# Patient Record
Sex: Female | Born: 1990 | Race: White | Hispanic: No | Marital: Single | Smoking: Never smoker
Health system: Southern US, Community
[De-identification: ages and names within clinical notes are randomized; demographics above are authoritative.]

## PROBLEM LIST (undated history)

## (undated) ENCOUNTER — Inpatient Hospital Stay (HOSPITAL_COMMUNITY): Payer: Self-pay

## (undated) DIAGNOSIS — G43909 Migraine, unspecified, not intractable, without status migrainosus: Secondary | ICD-10-CM

## (undated) DIAGNOSIS — F419 Anxiety disorder, unspecified: Secondary | ICD-10-CM

## (undated) HISTORY — PX: TONSILLECTOMY: SUR1361

## (undated) HISTORY — DX: Migraine, unspecified, not intractable, without status migrainosus: G43.909

---
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
## 2012-07-01 ENCOUNTER — Other Ambulatory Visit: Payer: Self-pay | Admitting: Diagnostic Neuroimaging

## 2012-07-01 DIAGNOSIS — H4710 Unspecified papilledema: Secondary | ICD-10-CM

## 2012-07-15 ENCOUNTER — Other Ambulatory Visit: Payer: Self-pay

## 2012-07-16 ENCOUNTER — Ambulatory Visit (INDEPENDENT_AMBULATORY_CARE_PROVIDER_SITE_OTHER): Payer: BC Managed Care – PPO

## 2012-07-16 DIAGNOSIS — R51 Headache: Secondary | ICD-10-CM

## 2012-07-16 DIAGNOSIS — H4710 Unspecified papilledema: Secondary | ICD-10-CM

## 2012-07-21 ENCOUNTER — Telehealth: Payer: Self-pay | Admitting: Diagnostic Neuroimaging

## 2012-07-21 NOTE — Telephone Encounter (Signed)
I called patient on both numbers but could not reach her. Her main number is disconnected. The other number is busy.  I will request triaged pool to contact patient by phone or letter. I would like to review her MRI results with patient and set her up for a spinal tap.  Suanne Marker, MD 07/21/2012, 7:37 PM Certified in Neurology, Neurophysiology and Neuroimaging  [facility] [address] [place], [place] [address] [phone]

## 2012-07-22 ENCOUNTER — Encounter: Payer: Self-pay | Admitting: *Deleted

## 2012-07-22 ENCOUNTER — Telehealth: Payer: Self-pay

## 2012-07-22 NOTE — Telephone Encounter (Signed)
Letter was mailed to pt to get contact information.  Existing contact number has been disconnected.

## 2012-07-22 NOTE — Telephone Encounter (Signed)
Patient  calling for MRI results phone number has been updated. [phone]

## 2012-07-22 NOTE — Telephone Encounter (Signed)
Please call patient for essentially normal MRI and MRV of brain

## 2012-07-23 NOTE — Telephone Encounter (Signed)
Left message for an essentially normal MRI and MRV of brain, per Dr. Terrace Arabia.  I also left message that Dr. Marjory Lies would like her to have an LP, and to call us back so we can schedule.

## 2012-07-24 ENCOUNTER — Telehealth: Payer: Self-pay | Admitting: Diagnostic Neuroimaging

## 2012-07-24 NOTE — Telephone Encounter (Signed)
I spoke to pt and let her know that Dr. Marjory Lies wanted to proceed with LP for checking for papilledema.  Pt did speak with Dr. Marjory Lies.

## 2012-07-24 NOTE — Telephone Encounter (Signed)
Attempted to call patient, Left VM asking that patient call again so we could get her questions answered regarding LP. Su Ley BS RN

## 2012-07-30 ENCOUNTER — Ambulatory Visit
Admission: RE | Admit: 2012-07-30 | Discharge: 2012-07-30 | Disposition: A | Discharge status: Home / Self Care | Payer: BC Managed Care – PPO | Source: Ambulatory Visit | Attending: Diagnostic Neuroimaging | Admitting: Diagnostic Neuroimaging

## 2012-07-30 VITALS — BP 131/88 | HR 66 | Ht 63.0 in | Wt 200.0 lb

## 2012-07-30 DIAGNOSIS — H4710 Unspecified papilledema: Secondary | ICD-10-CM

## 2012-07-30 LAB — GLUCOSE, CSF: Glucose, CSF: 63 mg/dL (ref 43–76)

## 2012-07-30 LAB — CSF CELL COUNT WITH DIFFERENTIAL
RBC Count, CSF: 3 cu mm — ABNORMAL HIGH
WBC, CSF: 1 cu mm (ref 0–5)

## 2012-07-31 LAB — GRAM STAIN: Gram Stain: NONE SEEN

## 2012-08-02 ENCOUNTER — Other Ambulatory Visit: Payer: Self-pay | Admitting: Neurology

## 2012-08-02 ENCOUNTER — Ambulatory Visit (HOSPITAL_COMMUNITY)
Admission: RE | Admit: 2012-08-02 | Discharge: 2012-08-02 | Disposition: A | Discharge status: Home / Self Care | Payer: BC Managed Care – PPO | Source: Ambulatory Visit | Attending: Neurology | Admitting: Neurology

## 2012-08-02 DIAGNOSIS — R51 Headache: Secondary | ICD-10-CM

## 2012-08-02 DIAGNOSIS — Y844 Aspiration of fluid as the cause of abnormal reaction of the patient, or of later complication, without mention of misadventure at the time of the procedure: Secondary | ICD-10-CM | POA: Insufficient documentation

## 2012-08-02 DIAGNOSIS — G971 Other reaction to spinal and lumbar puncture: Secondary | ICD-10-CM | POA: Insufficient documentation

## 2012-08-02 MED ORDER — IOHEXOL 180 MG/ML  SOLN
20.0000 mL | Freq: Once | INTRAMUSCULAR | Status: AC | PRN
  Administered 2012-08-02: 3 mL via EPIDURAL

## 2012-08-02 NOTE — Procedures (Signed)
R L3-4 epidural blood patch

## 2012-08-03 ENCOUNTER — Telehealth: Payer: Self-pay | Admitting: Diagnostic Neuroimaging

## 2012-08-03 NOTE — Telephone Encounter (Signed)
Will contacted me throughout Saturday the and again early Sunday with severe spinal tap headaches. A lumbar puncture took place on Thursday last week. She stated that she had called the office during regular office hours in the morning on Friday but had no call back received. I. arranged Sunday for her to have a blood patch. Preliminary results from her spinal fluid testing were available and Dr. Bonnielee Haff informed me of loss and took the patient at 10 AM on Sunday for a blood patch.

## 2012-08-03 NOTE — Progress Notes (Signed)
Patient called to report ongoing headache and "swimminess" since LP here 07/30/12 and Blood Patch at [facility] 08/02/12.  She states she has been on strict bedrest since the Blood Patch.  Suggested she contact Dr. Marjory Lies who sent her here for the LP in order for him to assess her and determine what to do next.  Donell Sievert, RN

## 2012-08-04 ENCOUNTER — Telehealth: Payer: Self-pay | Admitting: Diagnostic Neuroimaging

## 2012-08-04 NOTE — Telephone Encounter (Signed)
Spoke to pt. She states she is still feeling a little dizzy, but better. Hoping feeling will subside in time. Says she faxed over work form. Explained process. Pt agreed.

## 2012-08-06 ENCOUNTER — Telehealth: Payer: Self-pay | Admitting: *Deleted

## 2012-08-06 DIAGNOSIS — Z0289 Encounter for other administrative examinations: Secondary | ICD-10-CM

## 2012-08-06 NOTE — Telephone Encounter (Signed)
I called pt. Reviewed results. LP opening pressure slightly elevated. Clinical context suggests pseudotumor cerebri. WIll increase TPX to 50mg  BID, and will have her follow up with eye doctor. If papilledema is persistent or worse, then will switch TPX to diamox. If eyes gettign better, then will stay on TPX.  She will come by office tomorrow for an out of work excuse note.   Suanne Marker, MD 08/06/2012, 7:21 PM Certified in Neurology, Neurophysiology and Neuroimaging  [facility] [address] [place], [place] [address] [phone]

## 2012-08-06 NOTE — Telephone Encounter (Signed)
Patient came in today to drop off a form to be filled out.  She also needs a note for being out of work due to the LP and need for blood patch.  She plans to return to work tomorrow. States she had a note but it was only for Monday.   She would also like a call to discuss test results.  Please call today.

## 2012-08-06 NOTE — Telephone Encounter (Signed)
Dr Marjory Lies , please call your patient. CD

## 2012-08-06 NOTE — Telephone Encounter (Signed)
Patient wants to discuss lab results  

## 2012-08-07 ENCOUNTER — Encounter: Payer: Self-pay | Admitting: Neurology

## 2012-08-07 NOTE — Telephone Encounter (Signed)
Patient had blood patch and picked up an work note on 08-07-12.

## 2012-08-19 ENCOUNTER — Telehealth: Payer: Self-pay | Admitting: Diagnostic Neuroimaging

## 2012-08-21 ENCOUNTER — Telehealth: Payer: Self-pay | Admitting: Diagnostic Neuroimaging

## 2012-08-21 NOTE — Telephone Encounter (Signed)
Patient is calling to tell us she needs her ltr for missing work.  She needs this as soon as possible.  She states she's afraid she may get fired if she doesn't get this note.  She said she had spoken with Vikki Ports in the past.  Please call her at her home phone number asap.

## 2012-08-24 ENCOUNTER — Telehealth: Payer: Self-pay | Admitting: Diagnostic Neuroimaging

## 2012-08-24 NOTE — Telephone Encounter (Signed)
Patient is calling to tell us she needs to speak with Vikki Ports regarding her paper work.  Drina tells [name] question  #5 on her paper work form was not answered, so it needs to be updated.  Patient is asking to call asap.Taylor Jackson

## 2012-08-25 NOTE — Telephone Encounter (Signed)
Taylor Jackson has contacted patient, paperwork is completed.

## 2012-08-25 NOTE — Telephone Encounter (Signed)
Patient was called, no note needed, just paperwork from her company.

## 2012-08-25 NOTE — Telephone Encounter (Signed)
Taylor Jackson spoke to patient and clarification of paperwork completed.

## 2012-08-26 ENCOUNTER — Telehealth: Payer: Self-pay | Admitting: Diagnostic Neuroimaging

## 2012-09-15 ENCOUNTER — Telehealth: Payer: Self-pay | Admitting: Diagnostic Neuroimaging

## 2012-09-15 NOTE — Telephone Encounter (Signed)
Returned patient's call. No answer. Will try later.

## 2012-09-15 NOTE — Telephone Encounter (Signed)
Patient wanted to know the next step. Says she visited with ophthalmologist who says she still has a little edema, but not any worse. I went over Dr. Richrd Humbles note from 08/06/12. Patient agreed. C/o back pain as she is working also. Says in the middle of the day back pain is at its worst.  Requesting advice.

## 2012-09-16 ENCOUNTER — Telehealth: Payer: Self-pay

## 2012-09-16 NOTE — Telephone Encounter (Signed)
I called and spoke with patient to see how she was doing since returning to work. She went back to work after LP. She has ophthalmologist appointment yesterday.  exam yesterday.  We reviewed her work status. She returned to work after her LP. She has had pain in her back since then. Her vision has significantly improved. She saw the ophthalmologist who also said her vision was much better. Her complicated symptoms are the result of her LP. Her limitations/complications per her report are as follows.  Any bending and pulling cause the most pain: 9-10/10 Occasional reaching causes strain but, feels she can do this up to 10 times per day. Repeated lifting of greater than 5# causes significant pain as well. She is fine with occasional lifting of 5# (5-10 X day). Climbing is not a regular part of patient's job. One to 3 times per week is not a problem. Twisting occasionally is okay as long as it's with light activity. Standing up to 2 hours at a time but no more than 8 hours a day with frequent breaks to stretch and sit (about every 2 hours for 5-10 minutes). No restrictions with hearing or seeing.  I instructed patient to make follow up appointment and that one was due in 2-3 months from last appointment.

## 2012-09-25 ENCOUNTER — Telehealth: Payer: Self-pay

## 2012-09-25 NOTE — Telephone Encounter (Signed)
I called and let patient know she needs to sign form. 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 So form could be faxed in today. I put a note on cover sheet that Original is being sent to patient for signature. Forms faxed to [employer].

## 2012-10-15 ENCOUNTER — Other Ambulatory Visit: Payer: Self-pay | Admitting: *Deleted

## 2012-10-15 ENCOUNTER — Ambulatory Visit (INDEPENDENT_AMBULATORY_CARE_PROVIDER_SITE_OTHER): Payer: BC Managed Care – PPO | Admitting: Diagnostic Neuroimaging

## 2012-10-15 ENCOUNTER — Encounter: Payer: Self-pay | Admitting: Diagnostic Neuroimaging

## 2012-10-15 VITALS — BP 135/88 | HR 56 | Ht 62.5 in | Wt 205.0 lb

## 2012-10-15 DIAGNOSIS — R51 Headache: Secondary | ICD-10-CM | POA: Insufficient documentation

## 2012-10-15 DIAGNOSIS — H9319 Tinnitus, unspecified ear: Secondary | ICD-10-CM | POA: Insufficient documentation

## 2012-10-15 DIAGNOSIS — R519 Headache, unspecified: Secondary | ICD-10-CM | POA: Insufficient documentation

## 2012-10-15 DIAGNOSIS — H4710 Unspecified papilledema: Secondary | ICD-10-CM | POA: Insufficient documentation

## 2012-10-15 DIAGNOSIS — G932 Benign intracranial hypertension: Secondary | ICD-10-CM

## 2012-10-15 DIAGNOSIS — M545 Low back pain, unspecified: Secondary | ICD-10-CM | POA: Insufficient documentation

## 2012-10-15 NOTE — Patient Instructions (Signed)
Continue topiramate.   I will setup physical therapy for back issues.

## 2012-10-15 NOTE — Progress Notes (Signed)
[facility]  PATIENT: Taylor Jackson DOB: [birth year]  REFERRING CLINICIAN:  HISTORY FROM: patient and mother REASON FOR VISIT: follow up   HISTORICAL  CHIEF COMPLAINT:  Chief Complaint  Patient presents with  . Follow-up    RV .Marland Kitchen#7    HISTORY OF PRESENT ILLNESS:   UPDATE 10/15/12: Since last visit patient had MRI, MRV of the head which were unremarkable. Lumbar puncture showed opening pressure 21 cm water. Patient has continued on topiramate 50 mg twice a day. She's doing well. Last funduscopic exam with optometrist showed improvement and papilledema and vision. There were still some mild residual edema. Overall patient's headaches have significantly improved. Post procedure from lumbar puncture, patient had significant headache requiring epidural blood patch. Following this she had some ongoing low back pain.  PRIOR HPI (04/24/12):  22 year old left-handed female here for evaluation of papilledema.  For past 2 years patient will bitemporal bifrontal, throbbing headaches, sometimes associated with nausea. Her past one year she's had intermittent blurred vision. Over the past 2 months she's had ringing sensation in her left greater than right ear. Patient went to the eye doctor for evaluation was diagnosed bilateral papilledema and visual field defects.  Of note patient has had significant weight gain over the past 3-4 years. Patient previously weighed 140 pounds. Now she weighs 224 pounds. This happened in the setting of starting a variety of different contraceptives.  REVIEW OF SYSTEMS: Full 14 system review of systems performed and notable only for diarrhea cramps consistently frequent infection mild headache.  ALLERGIES: No Known Allergies  HOME MEDICATIONS: No outpatient prescriptions prior to visit.   No facility-administered medications prior to visit.    PAST MEDICAL HISTORY: Past Medical History  Diagnosis Date  . Migraine     PAST SURGICAL  HISTORY: Past Surgical History  Procedure Laterality Date  . Tonsillectomy      FAMILY HISTORY: Family History  Problem Relation Age of Onset  . Lupus    . Asthma    . Hypertension    . Cancer    . Crohn's disease    . Kidney disease      SOCIAL HISTORY:  History   Social History  . Marital Status: Single    Spouse Name: N/A    Number of Children: 0  . Years of Education: HS   Occupational History  .      [employer]   Social History Main Topics  . Smoking status: Never Smoker   . Smokeless tobacco: Never Used  . Alcohol Use: Not on file     Comment: few alcohol drinks per month  . Drug Use: No  . Sexually Active: Not on file   Other Topics Concern  . Not on file   Social History Narrative   Patient lives at home with her mother.   Caffeine Use: daily     PHYSICAL EXAM  Filed Vitals:   10/15/12 1500  BP: 135/88  Pulse: 56  Height: 5' 2.5" (1.588 m)  Weight: 205 lb (92.987 kg)    Not recorded    Body mass index is 36.87 kg/(m^2).  GENERAL EXAM: Patient is in no distress  CARDIOVASCULAR: Regular rate and rhythm, no murmurs, no carotid bruits  NEUROLOGIC: MENTAL STATUS: awake, alert, language fluent, comprehension intact, naming intact CRANIAL NERVE: no papilledema on fundoscopic exam, pupils equal and reactive to light, visual fields full to confrontation, extraocular muscles intact, no nystagmus, facial sensation and strength symmetric, uvula midline, shoulder shrug symmetric, tongue midline.  MOTOR: normal bulk and tone, full strength in the BUE, BLE SENSORY: normal and symmetric to light touch COORDINATION: finger-nose-finger, fine finger movements normal REFLEXES: deep tendon reflexes present and symmetric GAIT/STATION: narrow based gait; able to walk tandem; romberg is negative   DIAGNOSTIC DATA (LABS, IMAGING, TESTING) - I reviewed patient records, labs, notes, testing and imaging myself where available.  No results found for this  basename: WBC, HGB, HCT, MCV, PLT   No results found for this basename: na, k, cl, co2, glucose, bun, creatinine, calcium, prot, albumin, ast, alt, alkphos, bilitot, gfrnonaa, gfraa   No results found for this basename: CHOL, HDL, LDLCALC, LDLDIRECT, TRIG, CHOLHDL   No results found for this basename: HGBA1C   No results found for this basename: VITAMINB12   No results found for this basename: TSH    07/17/12 MRI brain - partially empty sella. No acute findings.  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    ASSESSMENT AND PLAN  22 y.o. year old female  has a past medical history of Migraine. here with possible pseudotumor cerebri (papilledema, borderline opening pressure elevation 21cm H2O), with improvement on TPX.   PLAN: 1. Continue TPX 2. PT eval for back pain 3. Continue weight loss (feb 2014: 224lb --> July 2014: 205); great job!   Orders Placed This Encounter  Procedures  . Ambulatory referral to Physical Therapy      Suanne Marker, MD 10/15/2012, 4:05 PM Certified in Neurology, Neurophysiology and Neuroimaging  [facility] [address], [address] [place], [place] [address] [phone]

## 2013-04-19 ENCOUNTER — Ambulatory Visit: Payer: BC Managed Care – PPO | Admitting: Diagnostic Neuroimaging

## 2014-08-26 NOTE — Telephone Encounter (Signed)
Error

## 2017-01-30 ENCOUNTER — Ambulatory Visit (INDEPENDENT_AMBULATORY_CARE_PROVIDER_SITE_OTHER): Payer: Worker's Compensation | Admitting: Family

## 2017-01-30 ENCOUNTER — Encounter: Payer: Self-pay | Admitting: Family

## 2017-01-30 VITALS — BP 129/86 | HR 70 | Temp 98.2°F | Ht 62.5 in | Wt 188.0 lb

## 2017-01-30 DIAGNOSIS — M5442 Lumbago with sciatica, left side: Secondary | ICD-10-CM

## 2017-01-30 MED ORDER — PREDNISONE 10 MG (21) PO TBPK: ORAL_TABLET | ORAL | 0 refills | Status: DC

## 2017-01-30 MED ORDER — CYCLOBENZAPRINE HCL 5 MG PO TABS: 5.0000 mg | ORAL_TABLET | Freq: Three times a day (TID) | ORAL | 0 refills | Status: DC | PRN

## 2017-01-30 MED ORDER — NAPROXEN 500 MG PO TABS: 500.0000 mg | ORAL_TABLET | Freq: Two times a day (BID) | ORAL | 1 refills | Status: DC

## 2017-01-30 NOTE — Patient Instructions (Signed)
Sciatica Sciatica is pain, numbness, weakness, or tingling along the path of the sciatic nerve. The sciatic nerve starts in the lower back and runs down the back of each leg. The nerve controls the muscles in the lower leg and in the back of the knee. It also provides feeling (sensation) to the back of the thigh, the lower leg, and the sole of the foot. Sciatica is a symptom of another medical condition that pinches or puts pressure on the sciatic nerve. Generally, sciatica only affects one side of the body. Sciatica usually goes away on its own or with treatment. In some cases, sciatica may keep coming back (recur). What are the causes? This condition is caused by pressure on the sciatic nerve, or pinching of the sciatic nerve. This may be the result of:  A disk in between the bones of the spine (vertebrae) bulging out too far (herniated disk).  Age-related changes in the spinal disks (degenerative disk disease).  A pain disorder that affects a muscle in the buttock (piriformis syndrome).  Extra bone growth (bone spur) near the sciatic nerve.  An injury or break (fracture) of the pelvis.  Pregnancy.  Tumor (rare). What increases the risk? The following factors may make you more likely to develop this condition:  Playing sports that place pressure or stress on the spine, such as football or weight lifting.  Having poor strength and flexibility.  A history of back injury.  A history of back surgery.  Sitting for long periods of time.  Doing activities that involve repetitive bending or lifting.  Obesity. What are the signs or symptoms? Symptoms can vary from mild to very severe, and they may include:  Any of these problems in the lower back, leg, hip, or buttock:  Mild tingling or dull aches.  Burning sensations.  Sharp pains.  Numbness in the back of the calf or the sole of the foot.  Leg weakness.  Severe back pain that makes movement difficult. These symptoms may  get worse when you cough, sneeze, or laugh, or when you sit or stand for long periods of time. Being overweight may also make symptoms worse. In some cases, symptoms may recur over time. How is this diagnosed? This condition may be diagnosed based on:  Your symptoms.  A physical exam. Your health care provider may ask you to do certain movements to check whether those movements trigger your symptoms.  You may have tests, including:  Blood tests.  X-rays.  MRI.  CT scan. How is this treated? In many cases, this condition improves on its own, without any treatment. However, treatment may include:  Reducing or modifying physical activity during periods of pain.  Exercising and stretching to strengthen your abdomen and improve the flexibility of your spine.  Icing and applying heat to the affected area.  Medicines that help:  To relieve pain and swelling.  To relax your muscles.  Injections of medicines that help to relieve pain, irritation, and inflammation around the sciatic nerve (steroids).  Surgery. Follow these instructions at home: Medicines   Take over-the-counter and prescription medicines only as told by your health care provider.  Do not drive or operate heavy machinery while taking prescription pain medicine. Managing pain   If directed, apply ice to the affected area.  Put ice in a plastic bag.  Place a towel between your skin and the bag.  Leave the ice on for 20 minutes, 2-3 times a day.  After icing, apply heat to the   affected area before you exercise or as often as told by your health care provider. Use the heat source that your health care provider recommends, such as a moist heat pack or a heating pad.  Place a towel between your skin and the heat source.  Leave the heat on for 20-30 minutes.  Remove the heat if your skin turns bright red. This is especially important if you are unable to feel pain, heat, or cold. You may have a greater risk of  getting burned. Activity   Return to your normal activities as told by your health care provider. Ask your health care provider what activities are safe for you.  Avoid activities that make your symptoms worse.  Take brief periods of rest throughout the day. Resting in a lying or standing position is usually better than sitting to rest.  When you rest for longer periods, mix in some mild activity or stretching between periods of rest. This will help to prevent stiffness and pain.  Avoid sitting for long periods of time without moving. Get up and move around at least one time each hour.  Exercise and stretch regularly, as told by your health care provider.  Do not lift anything that is heavier than 10 lb (4.5 kg) while you have symptoms of sciatica. When you do not have symptoms, you should still avoid heavy lifting, especially repetitive heavy lifting.  When you lift objects, always use proper lifting technique, which includes:  Bending your knees.  Keeping the load close to your body.  Avoiding twisting. General instructions   Use good posture.  Avoid leaning forward while sitting.  Avoid hunching over while standing.  Maintain a healthy weight. Excess weight puts extra stress on your back and makes it difficult to maintain good posture.  Wear supportive, comfortable shoes. Avoid wearing high heels.  Avoid sleeping on a mattress that is too soft or too hard. A mattress that is firm enough to support your back when you sleep may help to reduce your pain.  Keep all follow-up visits as told by your health care provider. This is important. Contact a health care provider if:  You have pain that wakes you up when you are sleeping.  You have pain that gets worse when you lie down.  Your pain is worse than you have experienced in the past.  Your pain lasts longer than 4 weeks.  You experience unexplained weight loss. Get help right away if:  You lose control of your bowel  or bladder (incontinence).  You have:  Weakness in your lower back, pelvis, buttocks, or legs that gets worse.  Redness or swelling of your back.  A burning sensation when you urinate. This information is not intended to replace advice given to you by your health care provider. Make sure you discuss any questions you have with your health care provider. Document Released: 02/26/2001 Document Revised: 08/08/2015 Document Reviewed: 11/11/2014 Elsevier Interactive Patient Education  2017 [employer]vier Inc[employer]  

## 2017-01-30 NOTE — Progress Notes (Signed)
   Subjective:    Patient ID: Taylor Jackson, female    DOB: [birth year], 26 y.o.   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 Pt states she was "reaching and lifting" throughout the night, but noticed several hours into her shift around 11pm she had an aching pain in her lower back. PT states the pain is constant, but has intermittent pain radiating down her left leg.  Back Pain  This is a new problem. The current episode started yesterday. The problem occurs constantly. The problem has been waxing and waning since onset. The pain is present in the gluteal and lumbar spine. The quality of the pain is described as aching. The pain radiates to the left thigh. The pain is at a severity of 8/10. The pain is moderate. Associated symptoms include leg pain and weakness. Pertinent negatives include no bladder incontinence or bowel incontinence. Risk factors include sedentary lifestyle. She has tried NSAIDs for the symptoms. The treatment provided mild relief.      Review of Systems  Gastrointestinal: Negative for bowel incontinence.  Genitourinary: Negative for bladder incontinence.  Musculoskeletal: Positive for back pain.  Neurological: Positive for weakness.  All other systems reviewed and are negative.      Objective:   Physical Exam  Constitutional: She is oriented to person, place, and time. She appears well-developed and well-nourished. No distress.  HENT:  Head: Normocephalic and atraumatic.  Right Ear: External ear normal.  Mouth/Throat: Oropharynx is clear and moist.  Eyes: Pupils are equal, round, and reactive to light.  Neck: Normal range of motion. Neck supple. No thyromegaly present.  Cardiovascular: Normal rate, regular rhythm, normal heart sounds and intact distal pulses.  No murmur heard. Pulmonary/Chest: Effort normal and breath sounds normal. No respiratory distress. She has no wheezes.    Abdominal: Soft. Bowel sounds are normal. She exhibits no distension. There is no tenderness.  Musculoskeletal: Normal range of motion. She exhibits no edema or tenderness.  Mild Lumbar pain with flexion, negative SLR  Neurological: She is alert and oriented to person, place, and time. She has normal reflexes. No cranial nerve deficit.  Skin: Skin is warm and dry.  Psychiatric: She has a normal mood and affect. Her behavior is normal. Judgment and thought content normal.  Vitals reviewed.    BP 129/86   Pulse 70   Temp 98.2 F (36.8 C) (Oral)   Ht 5' 2.5" (1.588 m)   Wt 188 lb (85.3 kg)   BMI 33.84 kg/m      Assessment & Plan:  1. Acute left-sided low back pain with left-sided sciatica Rest Ice ROM exercises discussed Note to be off tonight and resume to regular duties on , Monday, 02/03/17 RTO prn  - predniSONE (STERAPRED UNI-PAK 21 TAB) 10 MG (21) TBPK tablet; Use as directed  Dispense: 21 tablet; Refill: 0 - naproxen (NAPROSYN) 500 MG tablet; Take 1 tablet (500 mg total) 2 (two) times daily with a meal by mouth.  Dispense: 60 tablet; Refill: 1     Jannifer Rodneyhristy Nazifa Trinka, FNP

## 2017-02-03 ENCOUNTER — Other Ambulatory Visit: Payer: Worker's Compensation | Admitting: Family

## 2017-02-04 ENCOUNTER — Encounter: Payer: Self-pay | Admitting: Family

## 2017-02-04 ENCOUNTER — Ambulatory Visit (INDEPENDENT_AMBULATORY_CARE_PROVIDER_SITE_OTHER): Payer: Worker's Compensation | Admitting: Family

## 2017-02-04 VITALS — BP 129/97 | HR 70 | Temp 97.2°F | Ht 62.5 in | Wt 188.6 lb

## 2017-02-04 DIAGNOSIS — M5442 Lumbago with sciatica, left side: Secondary | ICD-10-CM

## 2017-02-04 DIAGNOSIS — E669 Obesity, unspecified: Secondary | ICD-10-CM

## 2017-02-04 MED ORDER — GABAPENTIN 100 MG PO CAPS: 100.0000 mg | ORAL_CAPSULE | Freq: Three times a day (TID) | ORAL | 0 refills | Status: DC

## 2017-02-04 MED ORDER — KETOROLAC TROMETHAMINE 60 MG/2ML IM SOLN
60.0000 mg | Freq: Once | INTRAMUSCULAR | Status: AC
  Administered 2017-02-04: 60 mg via INTRAMUSCULAR

## 2017-02-04 NOTE — Progress Notes (Addendum)
   Subjective:    Patient ID: Taylor Jackson, female    DOB: January 03, 1991, 26 y.o.   MRN: [id number]  Pt presents to the office today to recheck Workers Comp Injury for [employer] that occurred on 01/29/17. Pt was seen in the office on 01/30/17 and given prednisone, naproxen, and flexeril as needed. Pt reports her pain is no different.  Back Pain  The current episode started in the past 7 days. The problem occurs constantly. The problem is unchanged. The pain is present in the lumbar spine. The quality of the pain is described as aching. The pain radiates to the left thigh. The pain is at a severity of 8/10. The pain is mild. The symptoms are aggravated by bending and twisting. Associated symptoms include leg pain and weakness. Pertinent negatives include no bladder incontinence or bowel incontinence. Risk factors include obesity and sedentary lifestyle. She has tried muscle relaxant, NSAIDs and bed rest for the symptoms. The treatment provided mild relief.      Review of Systems  Gastrointestinal: Negative for bowel incontinence.  Genitourinary: Negative for bladder incontinence.  Musculoskeletal: Positive for back pain.  Neurological: Positive for weakness.  All other systems reviewed and are negative.      Objective:   Physical Exam  Constitutional: She is oriented to person, place, and time. She appears well-developed and well-nourished. No distress.  HENT:  Head: Normocephalic.  Eyes: Pupils are equal, round, and reactive to light.  Neck: Normal range of motion. Neck supple. No thyromegaly present.  Cardiovascular: Normal rate, regular rhythm, normal heart sounds and intact distal pulses.  No murmur heard. Pulmonary/Chest: Effort normal and breath sounds normal. No respiratory distress. She has no wheezes.  Abdominal: Soft. Bowel sounds are normal. She exhibits no distension. There is no tenderness.  Musculoskeletal: Normal range of motion. She exhibits no edema or tenderness.    Neurological: She is alert and oriented to person, place, and time.  Skin: Skin is warm and dry.  Psychiatric: She has a normal mood and affect. Her behavior is normal. Judgment and thought content normal.  Vitals reviewed.    BP (!) 129/97   Pulse 70   Temp (!) 97.2 F (36.2 C) (Oral)   Ht 5' 2.5" (1.588 m)   Wt 188 lb 9.6 oz (85.5 kg)   BMI 33.95 kg/m      Assessment & Plan:  1. Acute bilateral low back pain with left-sided sciatica Continue prednisone and naprosyn  Exercises discussed and referral to PT ordered Light Duty for 2 weeks  RTO in 2 weeks  Sedation precautions discussed - Ambulatory referral to Physical Therapy - gabapentin (NEURONTIN) 100 MG capsule; Take 1 capsule (100 mg total) by mouth 3 (three) times daily as needed.  Dispense: 42 capsule; Refill: 0 - ketorolac (TORADOL) injection 60 mg  2. Obesity (BMI 30-39.9) - Ambulatory referral to Physical Therapy    Jannifer Rodneyhristy Costa Jha, FNP

## 2017-02-04 NOTE — Patient Instructions (Signed)
Spondylolisthesis Rehab  Ask your health care provider which exercises are safe for you. Do exercises exactly as told by your health care provider and adjust them as directed. It is normal to feel mild stretching, pulling, tightness, or discomfort as you do these exercises, but you should stop right away if you feel sudden pain or your pain gets worse. Do not begin these exercises until told by your health care provider.  Stretching and range of motion exercises  These exercises warm up your muscles and joints and improve the movement and flexibility of your hips and your back. These exercises may also help to relieve pain, numbness, and tingling.  Exercise A: Single knee to chest    1. Lie on your back on a firm surface with both legs straight.  2. Bend one of your knees. Use your hands to move your knee up toward your chest until you feel a gentle stretch in your lower back and buttock.  ? Hold your leg in this position by holding onto the front of your knee.  ? Keep your other leg as straight as possible.  3. Hold for __________ seconds.  4. Slowly return to the starting position.  5. Repeat this exercise with your other leg.  Repeat __________ times. Complete this exercise __________ times a day.  Exercise B: Double knee to chest    1. Lie on your back on a firm surface with both legs straight.  2. Bend one of your knees and move it toward your chest until you feel a gentle stretch in your lower back and buttock.  3. Tense your abdominal muscles and repeat the previous step with your other leg.  4. Hold both of your legs in this position by holding onto the backs of your thighs or the fronts of your knees.  5. Hold for __________ seconds.  6. Tense your abdominal muscles and slowly move your legs back to the floor, one leg at a time.  Repeat __________ times. Complete this exercise __________ times a day.  Strengthening exercises  These exercises build strength and endurance in your back. Endurance is the  ability to use your muscles for a long time, even after they get tired.  Exercise C: Pelvic tilt  1. Lie on your back on a firm bed or the floor. Bend your knees and keep your feet flat.  2. Tense your abdominal muscles. Tip your pelvis up toward the ceiling and flatten your lower back into the floor.  ? To help with this exercise, you may place a small towel under your lower back and try to push your back into the towel.  3. Hold for __________ seconds.  4. Let your muscles relax completely before you repeat this exercise.  Repeat __________ times. Complete this exercise __________ times a day.  Exercise D: Abdominal crunch    1. Lie on your back on a firm surface. Bend your knees and keep your feet flat. Cross your arms over your chest.  2. Tuck your chin down toward your chest, without bending your neck.  3. Use your abdominal muscles to lift your upper body off of the ground, straight up into the air.  ? Try to lift yourself until your shoulder blades are off the ground. You may need to work up to this.  ? Keep your lower back on the ground while you crunch upward.  ? Do not hold your breath.  4. Slowly lower yourself down. Keep your abdominal muscles tense until   you are back to the starting position.  Repeat __________ times. Complete this exercise __________ times a day.  Exercise E: Alternating arm and leg raises    1. Get on your hands and knees on a firm surface. If you are on a hard floor, you may want to use padding to cushion your knees, such as an exercise mat.  2. Line up your arms and legs. Your hands should be below your shoulders, and your knees should be below your hips.  3. Lift your left leg behind you. At the same time, raise your right arm and straighten it in front of you.  ? Do not lift your leg higher than your hip.  ? Do not lift your arm higher than your shoulder.  ? Keep your abdominal and back muscles tight.  ? Keep your hips facing the ground.  ? Do not arch your back.  ? Keep your  balance carefully, and do not hold your breath.  4. Hold for __________ seconds.  5. Slowly return to the starting position and repeat with your right leg and your left arm.  Repeat __________ times. Complete this exercise __________ times a day.  Posture and body mechanics    Body mechanics refers to the movements and positions of your body while you do your daily activities. Posture is part of body mechanics. Good posture and healthy body mechanics can help to relieve stress in your body's tissues and joints. Good posture means that your spine is in its natural S-curve position (your spine is neutral), your shoulders are pulled back slightly, and your head is not tipped forward. The following are general guidelines for applying improved posture and body mechanics to your everyday activities.  Standing     When standing, keep your spine neutral and your feet about hip-width apart. Keep a slight bend in your knees. Your ears, shoulders, and hips should line up.   When you do a task in which you stand in one place for a long time, place one foot up on a stable object that is 2-4 inches (5-10 cm) high, such as a footstool. This helps keep your spine neutral.  Sitting     When sitting, keep your spine neutral and keep your feet flat on the floor. Use a footrest, if necessary, and keep your thighs parallel to the floor. Avoid rounding your shoulders, and avoid tilting your head forward.   When working at a desk or a computer, keep your desk at a height where your hands are slightly lower than your elbows. Slide your chair under your desk so you are close enough to maintain good posture.   When working at a computer, place your monitor at a height where you are looking straight ahead and you do not have to tilt your head forward or downward to look at the screen.  Resting    When lying down and resting, avoid positions that are most painful for you.   If you have pain with activities such as sitting, bending,  stooping, or squatting (flexion-based activities), lie in a position in which your body does not bend very much. For example, avoid curling up on your side with your arms and knees near your chest (fetal position).   If you have pain with activities such as standing for a long time or reaching with your arms (extension-based activities), lie with your spine in a neutral position and bend your knees slightly. Try the following positions:  ? Lying on   your side with a pillow between your knees.  ? Lying on your back with a pillow under your knees.    Lifting     When lifting objects, keep your feet at least shoulder-width apart and tighten your abdominal muscles.   Bend your knees and hips and keep your spine neutral. It is important to lift using the strength of your legs, not your back. Do not lock your knees straight out.   Always ask for help to lift heavy or awkward objects.  This information is not intended to replace advice given to you by your health care provider. Make sure you discuss any questions you have with your health care provider.  Document Released: 03/04/2005 Document Revised: 11/09/2015 Document Reviewed: 12/13/2014  Elsevier Interactive Patient Education  2018 [employer]

## 2017-02-20 ENCOUNTER — Encounter: Payer: Self-pay | Admitting: Family

## 2017-02-20 ENCOUNTER — Ambulatory Visit (INDEPENDENT_AMBULATORY_CARE_PROVIDER_SITE_OTHER): Payer: Worker's Compensation | Admitting: Family

## 2017-02-20 VITALS — BP 124/88 | HR 86 | Temp 98.2°F | Ht 62.5 in | Wt 191.0 lb

## 2017-02-20 DIAGNOSIS — M5442 Lumbago with sciatica, left side: Secondary | ICD-10-CM

## 2017-02-20 MED ORDER — NAPROXEN 500 MG PO TABS: 500.0000 mg | ORAL_TABLET | Freq: Two times a day (BID) | ORAL | 1 refills | Status: DC

## 2017-02-20 MED ORDER — CYCLOBENZAPRINE HCL 5 MG PO TABS: 5.0000 mg | ORAL_TABLET | Freq: Three times a day (TID) | ORAL | 0 refills | Status: DC | PRN

## 2017-02-20 MED ORDER — GABAPENTIN 100 MG PO CAPS: 100.0000 mg | ORAL_CAPSULE | Freq: Three times a day (TID) | ORAL | 0 refills | Status: DC

## 2017-02-20 NOTE — Progress Notes (Signed)
   Subjective:    Patient ID: Taylor Jackson, female    DOB: [birth year], 26 y.o.   MRN: [id number]  Pt presents to the office today for Workers Comp injury to her back that occurred on 01/29/17. Pt was diagnosed with low back pain with sciatic. PT was treated with  prednisone, naprosyn, gabapentin, and flexeril. PT states her back has greatly improved, but continues to "feel it".  Back Pain  The current episode started 1 to 4 weeks ago. The problem occurs intermittently. The problem has been waxing and waning since onset. The pain is present in the lumbar spine. The quality of the pain is described as aching. The pain radiates to the left thigh. The pain is at a severity of 2/10. The pain is mild. The symptoms are aggravated by bending and standing. Associated symptoms include leg pain. Pertinent negatives include no bladder incontinence, bowel incontinence or weakness. She has tried muscle relaxant, bed rest, NSAIDs, walking and home exercises for the symptoms. The treatment provided moderate relief.      Review of Systems  Gastrointestinal: Negative for bowel incontinence.  Genitourinary: Negative for bladder incontinence.  Musculoskeletal: Positive for back pain.  Neurological: Negative for weakness.  All other systems reviewed and are negative.      Objective:   Physical Exam  Constitutional: She is oriented to person, place, and time. She appears well-developed and well-nourished. No distress.  HENT:  Head: Normocephalic.  Eyes: Pupils are equal, round, and reactive to light.  Neck: Normal range of motion. Neck supple. No thyromegaly present.  Cardiovascular: Normal rate, regular rhythm, normal heart sounds and intact distal pulses.  No murmur heard. Pulmonary/Chest: Effort normal and breath sounds normal. No respiratory distress. She has no wheezes.  Abdominal: Soft. Bowel sounds are normal. She exhibits no distension. There is no tenderness.  Musculoskeletal: Normal range of  motion. She exhibits no edema or tenderness.  Negative SLR  Neurological: She is alert and oriented to person, place, and time.  Skin: Skin is warm and dry.  Psychiatric: She has a normal mood and affect. Her behavior is normal. Judgment and thought content normal.  Vitals reviewed.   BP 124/88   Pulse 86   Temp 98.2 F (36.8 C) (Oral)   Ht 5' 2.5" (1.588 m)   Wt 191 lb (86.6 kg)   BMI 34.38 kg/m       Assessment & Plan:  1. Acute bilateral low back pain with left-sided sciatica Rest Ice and heat Continue ROM exercises Continue naprosyn and the gabapentin and flexeril as needed Will continue light duty for next 2 weeks and follow up and hopefully return to regular duties at that time RTO in 2 weeks   Jannifer Rodneyhristy Tarnesha Ulloa, FNP

## 2017-02-20 NOTE — Patient Instructions (Signed)
Sciatica Sciatica is pain, numbness, weakness, or tingling along the path of the sciatic nerve. The sciatic nerve starts in the lower back and runs down the back of each leg. The nerve controls the muscles in the lower leg and in the back of the knee. It also provides feeling (sensation) to the back of the thigh, the lower leg, and the sole of the foot. Sciatica is a symptom of another medical condition that pinches or puts pressure on the sciatic nerve. Generally, sciatica only affects one side of the body. Sciatica usually goes away on its own or with treatment. In some cases, sciatica may keep coming back (recur). What are the causes? This condition is caused by pressure on the sciatic nerve, or pinching of the sciatic nerve. This may be the result of:  A disk in between the bones of the spine (vertebrae) bulging out too far (herniated disk).  Age-related changes in the spinal disks (degenerative disk disease).  A pain disorder that affects a muscle in the buttock (piriformis syndrome).  Extra bone growth (bone spur) near the sciatic nerve.  An injury or break (fracture) of the pelvis.  Pregnancy.  Tumor (rare). What increases the risk? The following factors may make you more likely to develop this condition:  Playing sports that place pressure or stress on the spine, such as football or weight lifting.  Having poor strength and flexibility.  A history of back injury.  A history of back surgery.  Sitting for long periods of time.  Doing activities that involve repetitive bending or lifting.  Obesity. What are the signs or symptoms? Symptoms can vary from mild to very severe, and they may include:  Any of these problems in the lower back, leg, hip, or buttock:  Mild tingling or dull aches.  Burning sensations.  Sharp pains.  Numbness in the back of the calf or the sole of the foot.  Leg weakness.  Severe back pain that makes movement difficult. These symptoms may  get worse when you cough, sneeze, or laugh, or when you sit or stand for long periods of time. Being overweight may also make symptoms worse. In some cases, symptoms may recur over time. How is this diagnosed? This condition may be diagnosed based on:  Your symptoms.  A physical exam. Your health care provider may ask you to do certain movements to check whether those movements trigger your symptoms.  You may have tests, including:  Blood tests.  X-rays.  MRI.  CT scan. How is this treated? In many cases, this condition improves on its own, without any treatment. However, treatment may include:  Reducing or modifying physical activity during periods of pain.  Exercising and stretching to strengthen your abdomen and improve the flexibility of your spine.  Icing and applying heat to the affected area.  Medicines that help:  To relieve pain and swelling.  To relax your muscles.  Injections of medicines that help to relieve pain, irritation, and inflammation around the sciatic nerve (steroids).  Surgery. Follow these instructions at home: Medicines   Take over-the-counter and prescription medicines only as told by your health care provider.  Do not drive or operate heavy machinery while taking prescription pain medicine. Managing pain   If directed, apply ice to the affected area.  Put ice in a plastic bag.  Place a towel between your skin and the bag.  Leave the ice on for 20 minutes, 2-3 times a day.  After icing, apply heat to the   affected area before you exercise or as often as told by your health care provider. Use the heat source that your health care provider recommends, such as a moist heat pack or a heating pad.  Place a towel between your skin and the heat source.  Leave the heat on for 20-30 minutes.  Remove the heat if your skin turns bright red. This is especially important if you are unable to feel pain, heat, or cold. You may have a greater risk of  getting burned. Activity   Return to your normal activities as told by your health care provider. Ask your health care provider what activities are safe for you.  Avoid activities that make your symptoms worse.  Take brief periods of rest throughout the day. Resting in a lying or standing position is usually better than sitting to rest.  When you rest for longer periods, mix in some mild activity or stretching between periods of rest. This will help to prevent stiffness and pain.  Avoid sitting for long periods of time without moving. Get up and move around at least one time each hour.  Exercise and stretch regularly, as told by your health care provider.  Do not lift anything that is heavier than 10 lb (4.5 kg) while you have symptoms of sciatica. When you do not have symptoms, you should still avoid heavy lifting, especially repetitive heavy lifting.  When you lift objects, always use proper lifting technique, which includes:  Bending your knees.  Keeping the load close to your body.  Avoiding twisting. General instructions   Use good posture.  Avoid leaning forward while sitting.  Avoid hunching over while standing.  Maintain a healthy weight. Excess weight puts extra stress on your back and makes it difficult to maintain good posture.  Wear supportive, comfortable shoes. Avoid wearing high heels.  Avoid sleeping on a mattress that is too soft or too hard. A mattress that is firm enough to support your back when you sleep may help to reduce your pain.  Keep all follow-up visits as told by your health care provider. This is important. Contact a health care provider if:  You have pain that wakes you up when you are sleeping.  You have pain that gets worse when you lie down.  Your pain is worse than you have experienced in the past.  Your pain lasts longer than 4 weeks.  You experience unexplained weight loss. Get help right away if:  You lose control of your bowel  or bladder (incontinence).  You have:  Weakness in your lower back, pelvis, buttocks, or legs that gets worse.  Redness or swelling of your back.  A burning sensation when you urinate. This information is not intended to replace advice given to you by your health care provider. Make sure you discuss any questions you have with your health care provider. Document Released: 02/26/2001 Document Revised: 08/08/2015 Document Reviewed: 11/11/2014 Elsevier Interactive Patient Education  2017 [employer]vier Inc[employer]  

## 2017-03-06 ENCOUNTER — Other Ambulatory Visit: Payer: Self-pay | Admitting: Family

## 2017-03-24 ENCOUNTER — Ambulatory Visit (INDEPENDENT_AMBULATORY_CARE_PROVIDER_SITE_OTHER): Payer: Worker's Compensation | Admitting: Family

## 2017-03-24 ENCOUNTER — Encounter: Payer: Self-pay | Admitting: Family

## 2017-03-24 VITALS — BP 134/90 | HR 68 | Temp 97.2°F | Ht 62.5 in | Wt 199.0 lb

## 2017-03-24 DIAGNOSIS — M5442 Lumbago with sciatica, left side: Secondary | ICD-10-CM

## 2017-03-24 MED ORDER — NAPROXEN 500 MG PO TABS
500.0000 mg | ORAL_TABLET | Freq: Two times a day (BID) | ORAL | 1 refills | Status: DC
Start: 2017-03-24 — End: 2017-04-02

## 2017-03-24 MED ORDER — CYCLOBENZAPRINE HCL 5 MG PO TABS: 5.0000 mg | ORAL_TABLET | Freq: Three times a day (TID) | ORAL | 0 refills | Status: DC | PRN

## 2017-03-24 NOTE — Patient Instructions (Signed)
Sciatica Rehab  Ask your health care provider which exercises are safe for you. Do exercises exactly as told by your health care provider and adjust them as directed. It is normal to feel mild stretching, pulling, tightness, or discomfort as you do these exercises, but you should stop right away if you feel sudden pain or your pain gets worse. Do not begin these exercises until told by your health care provider.  Stretching and range of motion exercises  These exercises warm up your muscles and joints and improve the movement and flexibility of your hips and your back. These exercises also help to relieve pain, numbness, and tingling.  Exercise A: Sciatic nerve glide  1. Sit in a chair with your head facing down toward your chest. Place your hands behind your back. Let your shoulders slump forward.  2. Slowly straighten one of your knees while you tilt your head back as if you are looking toward the ceiling. Only straighten your leg as far as you can without making your symptoms worse.  3. Hold for __________ seconds.  4. Slowly return to the starting position.  5. Repeat with your other leg.  Repeat __________ times. Complete this exercise __________ times a day.  Exercise B: Knee to chest with hip adduction and internal rotation    1. Lie on your back on a firm surface with both legs straight.  2. Bend one of your knees and move it up toward your chest until you feel a gentle stretch in your lower back and buttock. Then, move your knee toward the shoulder that is on the opposite side from your leg.  ? Hold your leg in this position by holding onto the front of your knee.  3. Hold for __________ seconds.  4. Slowly return to the starting position.  5. Repeat with your other leg.  Repeat __________ times. Complete this exercise __________ times a day.  Exercise C: Prone extension on elbows    1. Lie on your abdomen on a firm surface. A bed may be too soft for this exercise.  2. Prop yourself up on your  elbows.  3. Use your arms to help lift your chest up until you feel a gentle stretch in your abdomen and your lower back.  ? This will place some of your body weight on your elbows. If this is uncomfortable, try stacking pillows under your chest.  ? Your hips should stay down, against the surface that you are lying on. Keep your hip and back muscles relaxed.  4. Hold for __________ seconds.  5. Slowly relax your upper body and return to the starting position.  Repeat __________ times. Complete this exercise __________ times a day.  Strengthening exercises  These exercises build strength and endurance in your back. Endurance is the ability to use your muscles for a long time, even after they get tired.  Exercise D: Pelvic tilt  1. Lie on your back on a firm surface. Bend your knees and keep your feet flat.  2. Tense your abdominal muscles. Tip your pelvis up toward the ceiling and flatten your lower back into the floor.  ? To help with this exercise, you may place a small towel under your lower back and try to push your back into the towel.  3. Hold for __________ seconds.  4. Let your muscles relax completely before you repeat this exercise.  Repeat __________ times. Complete this exercise __________ times a day.  Exercise E: Alternating arm and leg raises      1. Get on your hands and knees on a firm surface. If you are on a hard floor, you may want to use padding to cushion your knees, such as an exercise mat.  2. Line up your arms and legs. Your hands should be below your shoulders, and your knees should be below your hips.  3. Lift your left leg behind you. At the same time, raise your right arm and straighten it in front of you.  ? Do not lift your leg higher than your hip.  ? Do not lift your arm higher than your shoulder.  ? Keep your abdominal and back muscles tight.  ? Keep your hips facing the ground.  ? Do not arch your back.  ? Keep your balance carefully, and do not hold your breath.  4. Hold for  __________ seconds.  5. Slowly return to the starting position and repeat with your right leg and your left arm.  Repeat __________ times. Complete this exercise __________ times a day.  Posture and body mechanics    Body mechanics refers to the movements and positions of your body while you do your daily activities. Posture is part of body mechanics. Good posture and healthy body mechanics can help to relieve stress in your body's tissues and joints. Good posture means that your spine is in its natural S-curve position (your spine is neutral), your shoulders are pulled back slightly, and your head is not tipped forward. The following are general guidelines for applying improved posture and body mechanics to your everyday activities.  Standing    · When standing, keep your spine neutral and your feet about hip-width apart. Keep a slight bend in your knees. Your ears, shoulders, and hips should line up.  · When you do a task in which you stand in one place for a long time, place one foot up on a stable object that is 2-4 inches (5-10 cm) high, such as a footstool. This helps keep your spine neutral.  Sitting    · When sitting, keep your spine neutral and keep your feet flat on the floor. Use a footrest, if necessary, and keep your thighs parallel to the floor. Avoid rounding your shoulders, and avoid tilting your head forward.  · When working at a desk or a computer, keep your desk at a height where your hands are slightly lower than your elbows. Slide your chair under your desk so you are close enough to maintain good posture.  · When working at a computer, place your monitor at a height where you are looking straight ahead and you do not have to tilt your head forward or downward to look at the screen.  Resting    · When lying down and resting, avoid positions that are most painful for you.  · If you have pain with activities such as sitting, bending, stooping, or squatting (flexion-based activities), lie in a  position in which your body does not bend very much. For example, avoid curling up on your side with your arms and knees near your chest (fetal position).  · If you have pain with activities such as standing for a long time or reaching with your arms (extension-based activities), lie with your spine in a neutral position and bend your knees slightly. Try the following positions:  ? Lying on your side with a pillow between your knees.  ? Lying on your back with a pillow under your knees.  Lifting    · When lifting   objects, keep your feet at least shoulder-width apart and tighten your abdominal muscles.  · Bend your knees and hips and keep your spine neutral. It is important to lift using the strength of your legs, not your back. Do not lock your knees straight out.  · Always ask for help to lift heavy or awkward objects.  This information is not intended to replace advice given to you by your health care provider. Make sure you discuss any questions you have with your health care provider.  Document Released: 03/04/2005 Document Revised: 11/09/2015 Document Reviewed: 11/18/2014  Elsevier Interactive Patient Education © 2018 [employer]

## 2017-03-24 NOTE — Progress Notes (Signed)
   Subjective:    Patient ID: Taylor Jackson, female    DOB: 1990/05/13, 27 y.o.   MRN: [id number]  Pt presents to the office today for Workers Comp injury to her back that occurred on 01/29/17. Pt was diagnosed with low back pain with sciatic. PT was treated with  prednisone, naprosyn, gabapentin, and flexeril. PT states her back has greatly improved, but continues to "come and go". States she has a tingling feeling in her left foot that comes and goes.  Back Pain  This is a new problem. The current episode started more than 1 month ago. The problem occurs intermittently. The problem has been waxing and waning since onset. The pain is present in the lumbar spine. The quality of the pain is described as aching. The pain radiates to the left foot. The pain is mild. Associated symptoms include leg pain, numbness and tingling. Risk factors include obesity. She has tried muscle relaxant and NSAIDs for the symptoms. The treatment provided mild relief.      Review of Systems  Musculoskeletal: Positive for back pain.  Neurological: Positive for tingling and numbness.  All other systems reviewed and are negative.      Objective:   Physical Exam  Constitutional: She is oriented to person, place, and time. She appears well-developed and well-nourished. No distress.  HENT:  Head: Normocephalic.  Eyes: Pupils are equal, round, and reactive to light.  Neck: Normal range of motion. Neck supple. No thyromegaly present.  Cardiovascular: Normal rate, regular rhythm, normal heart sounds and intact distal pulses.  No murmur heard. Pulmonary/Chest: Effort normal and breath sounds normal. No respiratory distress. She has no wheezes.  Abdominal: Soft. Bowel sounds are normal. She exhibits no distension. There is no tenderness.  Musculoskeletal: Normal range of motion. She exhibits no edema or tenderness.  Neurological: She is alert and oriented to person, place, and time.  Skin: Skin is warm and dry.    Psychiatric: She has a normal mood and affect. Her behavior is normal. Judgment and thought content normal.  Vitals reviewed.   BP (!) 133/91   Pulse 73   Temp (!) 97.2 F (36.2 C) (Oral)   Ht 5' 2.5" (1.588 m)   Wt 199 lb (90.3 kg)   BMI 35.82 kg/m      Assessment & Plan:  1. Acute bilateral low back pain with left-sided sciatica Will check on status of Physical Therapy  Light duty for 2 weeks then return to regular duties  Pt states she has not taken medications in last few weeks because she could not afford them, will reorder these RTO prn  - cyclobenzaprine (FLEXERIL) 5 MG tablet; Take 1 tablet (5 mg total) by mouth 3 (three) times daily as needed for muscle spasms.  Dispense: 60 tablet; Refill: 0 - naproxen (NAPROSYN) 500 MG tablet; Take 1 tablet (500 mg total) by mouth 2 (two) times daily with a meal.  Dispense: 60 tablet; Refill: 1     Jannifer Rodneyhristy Roc Streett, FNP

## 2017-03-25 ENCOUNTER — Telehealth: Payer: Self-pay | Admitting: Physician Assistant

## 2017-03-25 NOTE — Telephone Encounter (Signed)
WC wants to know patients return to work status

## 2017-03-26 NOTE — Telephone Encounter (Signed)
Pt can return to work, but on light duty for two weeks. Then return to regular duties.

## 2017-03-26 NOTE — Telephone Encounter (Signed)
[email or web address]

## 2017-03-27 ENCOUNTER — Telehealth: Payer: Self-pay | Admitting: *Deleted

## 2017-03-27 DIAGNOSIS — M5442 Lumbago with sciatica, left side: Secondary | ICD-10-CM

## 2017-03-27 NOTE — Telephone Encounter (Signed)
[facility] advised them she had not had an x-ray since her injury at work.  They are requesting an x-ray be done as patient states she is still in pain.  X-ray authorized by NIKEChristy Hawks,FNP.  S[employer] manager notified, and he will notify patient that she can come in for x-ray tomorrow.

## 2017-03-28 ENCOUNTER — Other Ambulatory Visit (INDEPENDENT_AMBULATORY_CARE_PROVIDER_SITE_OTHER): Payer: Worker's Compensation

## 2017-03-28 ENCOUNTER — Other Ambulatory Visit: Payer: Self-pay | Admitting: Family

## 2017-03-28 DIAGNOSIS — M5442 Lumbago with sciatica, left side: Secondary | ICD-10-CM

## 2017-04-01 NOTE — Telephone Encounter (Signed)
Left message for patient to call . No response for a week.

## 2017-04-02 ENCOUNTER — Encounter: Payer: Self-pay | Admitting: Family

## 2017-04-02 ENCOUNTER — Encounter: Payer: Self-pay | Admitting: Physical Therapy

## 2017-04-02 ENCOUNTER — Ambulatory Visit (INDEPENDENT_AMBULATORY_CARE_PROVIDER_SITE_OTHER): Payer: Worker's Compensation | Admitting: Family

## 2017-04-02 ENCOUNTER — Ambulatory Visit: Payer: Worker's Compensation | Attending: Family | Admitting: Physical Therapy

## 2017-04-02 VITALS — BP 124/92 | HR 77 | Temp 98.3°F | Ht 62.5 in | Wt 194.4 lb

## 2017-04-02 DIAGNOSIS — M545 Low back pain: Secondary | ICD-10-CM | POA: Diagnosis not present

## 2017-04-02 DIAGNOSIS — M5442 Lumbago with sciatica, left side: Secondary | ICD-10-CM

## 2017-04-02 MED ORDER — KETOROLAC TROMETHAMINE 60 MG/2ML IM SOLN
60.0000 mg | Freq: Once | INTRAMUSCULAR | Status: AC
  Administered 2017-04-02: 60 mg via INTRAMUSCULAR

## 2017-04-02 MED ORDER — METHYLPREDNISOLONE ACETATE 80 MG/ML IJ SUSP
80.0000 mg | Freq: Once | INTRAMUSCULAR | Status: AC
  Administered 2017-04-02: 80 mg via INTRAMUSCULAR

## 2017-04-02 MED ORDER — GABAPENTIN 100 MG PO CAPS: 100.0000 mg | ORAL_CAPSULE | Freq: Three times a day (TID) | ORAL | 0 refills | Status: DC

## 2017-04-02 MED ORDER — NAPROXEN 500 MG PO TABS: 500.0000 mg | ORAL_TABLET | Freq: Two times a day (BID) | ORAL | 1 refills | Status: DC

## 2017-04-02 NOTE — Therapy (Signed)
[facility] [address] [place], [place], [address] Phone: [phone]   Fax:  [phone]  Physical Therapy Evaluation  Patient Details  Name: Taylor Jackson MRN: [id number] Date of Birth: 13-Mar-1991 Referring Provider: Jannifer Rodney    Encounter Date: 04/02/2017  PT End of Session - 04/02/17 1058    Visit Number  1    Number of Visits  12    Date for PT Re-Evaluation  05/14/17    PT Start Time  0945    PT Stop Time  1032    PT Time Calculation (min)  47 min    Activity Tolerance  Patient tolerated treatment well    Behavior During Therapy  [facility] for tasks assessed/performed       Past Medical History:  Diagnosis Date  . Migraine     Past Surgical History:  Procedure Laterality Date  . TONSILLECTOMY      There were no vitals filed for this visit.   Subjective Assessment - 04/02/17 1050    Subjective  The patient reports falling from a stool at work on  a hard surface in mid- November landing on her left buttock.  She reports that longer her workshift goes the higher her pain level gets.  Pain commonly rises to an 8/10.  She reports she will experience pain down her left LE and has had occasions of left foot tingling.  Lying down decreases her pain.    Limitations  Standing    How long can you stand comfortably?  20 minutes.    Patient Stated Goals  Get back to normal.    Currently in Pain?  Yes    Pain Score  8     Pain Location  Back    Pain Orientation  Left    Pain Descriptors / Indicators  Aching;Throbbing;Tingling    Pain Type  Acute pain    Pain Onset  More than a month ago    Pain Frequency  Constant    Aggravating Factors   See above.    Pain Relieving Factors  See above.         [facility] PT Assessment - 04/02/17 0001      Assessment   Medical Diagnosis  Acute low back pain with left-sided sciatica.    Referring Provider  Jannifer Rodney     Onset Date/Surgical Date  -- Mid-November 2018.      Precautions    Precautions  None      Restrictions   Weight Bearing Restrictions  No      Balance Screen   Has the patient fallen in the past 6 months  Yes    How many times?  -- 1.    Has the patient had a decrease in activity level because of a fear of falling?   No    Is the patient reluctant to leave their home because of a fear of falling?   No      Home Public house manager residence      Prior Function   Level of Independence  Independent      Posture/Postural Control   Posture/Postural Control  Postural limitations      ROM / Strength   AROM / PROM / Strength  AROM;Strength      AROM   Overall AROM Comments  Active lumbar extension= 20 degrees and flexion 75% normal but performed very slowly in obvious.      Strength   Overall  Strength Comments  Left knee extension 4/5 limited likely due to pain.      Palpation   Palpation comment  Patient very tender to palpation over left iliolumbar ligament.      Special Tests   Other special tests  Normal bilateral LE DTR's; (-) SLR and FABER testing.      Bed Mobility   Bed Mobility  -- Independent.      Transfers   Transfers  -- Independent.      Ambulation/Gait   Gait Comments  Independent.             Objective measurements completed on examination: See above findings.      [facility] Adult PT Treatment/Exercise - 04/02/17 0001      Modalities   Modalities  Electrical Stimulation;Moist Heat      Moist Heat Therapy   Number Minutes Moist Heat  15 Minutes    Moist Heat Location  Lumbar Spine      Electrical Stimulation   Electrical Stimulation Location  Left SIJ region.    Electrical Stimulation Action  Pre-mod.    Electrical Stimulation Parameters  80-150 Hz x 15 minutes.    Electrical Stimulation Goals  Pain                  PT Long Term Goals - 04/02/17 1225      PT LONG TERM GOAL #1   Title  Independent with a HEP.    Time  6    Period  Weeks    Status  New      PT LONG  TERM GOAL #2   Title  Patient sit 30 minutes with equal weight bearing over ischial tuberosites with pain not > 2/10.    Time  6    Period  Weeks    Status  New      PT LONG TERM GOAL #3   Title  Eliminate left LE symptoms.    Time  6    Period  Weeks    Status  New      PT LONG TERM GOAL #4   Title  Perform regular workshift with pain not > 2-3/10.    Time  6    Period  Weeks    Status  New             Plan - 04/02/17 1213    Clinical Impression Statement  The patient was injured on the job when she fell off a stool on her left buttock.  During the history intake aspect of the evaluation she sat unweighting her left buttock.  Her pain was rated at an 8/10 today and she reports pain down the length of her left LE to her foot which can tingle on occasions.  Her LE DTR's are normal and special testing was negative.  Patient will benefit from skilled physical therapy to address pain.    Clinical Presentation  Stable    Rehab Potential  Excellent    PT Frequency  2x / week    PT Duration  6 weeks    PT Treatment/Interventions  ADLs/Self Care Home Management;Electrical Stimulation;Moist Heat;Traction;Ultrasound;Therapeutic exercise;Therapeutic activities;Patient/family education;Manual techniques    PT Next Visit Plan  Combo e'stim/U/S to left SIJ region; STW/M to left iliolumbar ligament; HMP and e'stim.  Begin core exercises and progression as appropriate.    Consulted and Agree with Plan of Care  Patient       Patient will benefit from skilled therapeutic intervention in  order to improve the following deficits and impairments:  Decreased activity tolerance, Pain, Decreased range of motion  Visit Diagnosis: Acute left-sided low back pain, with sciatica presence unspecified - Plan: PT plan of care cert/re-cert     Problem List Patient Active Problem List   Diagnosis Date Noted  . Headache(784.0) 10/15/2012  . Unspecified tinnitus 10/15/2012  . Papilloedema, unspecified  10/15/2012  . Pseudotumor cerebri 10/15/2012  . Low back pain 10/15/2012    Rosezetta Balderston, [place] MPT 04/02/2017, 12:30 PM  [facility] [address] [place], [place], [address] Phone: [phone]   Fax:  [phone]  Name: Taylor Jackson MRN: [id number] Date of Birth: [birth year]

## 2017-04-02 NOTE — Progress Notes (Signed)
   Subjective:    Patient ID: Taylor Jackson, female    DOB: [birth year], 27 y.o.   MRN: [id number]  Pt presents to the office today for Workers Comp injury to her back that occurred on 01/29/17. Pt was diagnosed with low back pain with sciatic. PT was treated with prednisone, naprosyn, gabapentin, and flexeril. PT started physical therapy today and will go weekly. Pt states her the pain intermittent pain of 8 out 10.   Back Pain  This is a recurrent problem. The current episode started more than 1 month ago. The problem occurs intermittently. The problem has been waxing and waning since onset. The pain is present in the lumbar spine. The quality of the pain is described as aching. The pain radiates to the left thigh. The pain is at a severity of 8/10. The pain is moderate. Associated symptoms include leg pain, numbness and tingling. Pertinent negatives include no bladder incontinence or bowel incontinence. She has tried muscle relaxant and NSAIDs (gabapentin) for the symptoms. The treatment provided mild relief.      Review of Systems  Gastrointestinal: Negative for bowel incontinence.  Genitourinary: Negative for bladder incontinence.  Musculoskeletal: Positive for back pain.  Neurological: Positive for tingling and numbness.  All other systems reviewed and are negative.      Objective:   Physical Exam  Constitutional: She is oriented to person, place, and time. She appears well-developed and well-nourished. No distress.  HENT:  Head: Normocephalic.  Eyes: Pupils are equal, round, and reactive to light.  Neck: Normal range of motion. Neck supple. No thyromegaly present.  Cardiovascular: Normal rate, regular rhythm, normal heart sounds and intact distal pulses.  No murmur heard. Pulmonary/Chest: Effort normal and breath sounds normal. No respiratory distress. She has no wheezes.  Abdominal: Soft. Bowel sounds are normal. She exhibits no distension. There is no tenderness.    Musculoskeletal: Normal range of motion. She exhibits no edema or tenderness.  Negative SLR  Neurological: She is alert and oriented to person, place, and time.  Skin: Skin is warm and dry.  Psychiatric: She has a normal mood and affect. Her behavior is normal. Judgment and thought content normal.  Vitals reviewed.     BP (!) 124/92   Pulse 77   Temp 98.3 F (36.8 C) (Oral)   Ht 5' 2.5" (1.588 m)   Wt 194 lb 6.4 oz (88.2 kg)   BMI 34.99 kg/m      Assessment & Plan:  1. Acute bilateral low back pain with left-sided sciatica Continue Physical Therapy  Encourage exercise and stretching at home Continue naprosyn  Light duty at work for next 2 weeks then resume regular duties RTO prn  - methylPREDNISolone acetate (DEPO-MEDROL) injection 80 mg - ketorolac (TORADOL) injection 60 mg - naproxen (NAPROSYN) 500 MG tablet; Take 1 tablet (500 mg total) by mouth 2 (two) times daily with a meal.  Dispense: 60 tablet; Refill: 1 - gabapentin (NEURONTIN) 100 MG capsule; Take 1 capsule (100 mg total) by mouth 3 (three) times daily.  Dispense: 42 capsule; Refill: 0    Jannifer Rodneyhristy Hawks, FNP

## 2017-04-02 NOTE — Patient Instructions (Signed)
Sciatica Sciatica is pain, numbness, weakness, or tingling along the path of the sciatic nerve. The sciatic nerve starts in the lower back and runs down the back of each leg. The nerve controls the muscles in the lower leg and in the back of the knee. It also provides feeling (sensation) to the back of the thigh, the lower leg, and the sole of the foot. Sciatica is a symptom of another medical condition that pinches or puts pressure on the sciatic nerve. Generally, sciatica only affects one side of the body. Sciatica usually goes away on its own or with treatment. In some cases, sciatica may keep coming back (recur). What are the causes? This condition is caused by pressure on the sciatic nerve, or pinching of the sciatic nerve. This may be the result of:  A disk in between the bones of the spine (vertebrae) bulging out too far (herniated disk).  Age-related changes in the spinal disks (degenerative disk disease).  A pain disorder that affects a muscle in the buttock (piriformis syndrome).  Extra bone growth (bone spur) near the sciatic nerve.  An injury or break (fracture) of the pelvis.  Pregnancy.  Tumor (rare).  What increases the risk? The following factors may make you more likely to develop this condition:  Playing sports that place pressure or stress on the spine, such as football or weight lifting.  Having poor strength and flexibility.  A history of back injury.  A history of back surgery.  Sitting for long periods of time.  Doing activities that involve repetitive bending or lifting.  Obesity.  What are the signs or symptoms? Symptoms can vary from mild to very severe, and they may include:  Any of these problems in the lower back, leg, hip, or buttock: ? Mild tingling or dull aches. ? Burning sensations. ? Sharp pains.  Numbness in the back of the calf or the sole of the foot.  Leg weakness.  Severe back pain that makes movement difficult.  These  symptoms may get worse when you cough, sneeze, or laugh, or when you sit or stand for long periods of time. Being overweight may also make symptoms worse. In some cases, symptoms may recur over time. How is this diagnosed? This condition may be diagnosed based on:  Your symptoms.  A physical exam. Your health care provider may ask you to do certain movements to check whether those movements trigger your symptoms.  You may have tests, including: ? Blood tests. ? X-rays. ? MRI. ? CT scan.  How is this treated? In many cases, this condition improves on its own, without any treatment. However, treatment may include:  Reducing or modifying physical activity during periods of pain.  Exercising and stretching to strengthen your abdomen and improve the flexibility of your spine.  Icing and applying heat to the affected area.  Medicines that help: ? To relieve pain and swelling. ? To relax your muscles.  Injections of medicines that help to relieve pain, irritation, and inflammation around the sciatic nerve (steroids).  Surgery.  Follow these instructions at home: Medicines  Take over-the-counter and prescription medicines only as told by your health care provider.  Do not drive or operate heavy machinery while taking prescription pain medicine. Managing pain  If directed, apply ice to the affected area. ? Put ice in a plastic bag. ? Place a towel between your skin and the bag. ? Leave the ice on for 20 minutes, 2-3 times a day.  After icing, apply   heat to the affected area before you exercise or as often as told by your health care provider. Use the heat source that your health care provider recommends, such as a moist heat pack or a heating pad. ? Place a towel between your skin and the heat source. ? Leave the heat on for 20-30 minutes. ? Remove the heat if your skin turns bright red. This is especially important if you are unable to feel pain, heat, or cold. You may have a  greater risk of getting burned. Activity  Return to your normal activities as told by your health care provider. Ask your health care provider what activities are safe for you. ? Avoid activities that make your symptoms worse.  Take brief periods of rest throughout the day. Resting in a lying or standing position is usually better than sitting to rest. ? When you rest for longer periods, mix in some mild activity or stretching between periods of rest. This will help to prevent stiffness and pain. ? Avoid sitting for long periods of time without moving. Get up and move around at least one time each hour.  Exercise and stretch regularly, as told by your health care provider.  Do not lift anything that is heavier than 10 lb (4.5 kg) while you have symptoms of sciatica. When you do not have symptoms, you should still avoid heavy lifting, especially repetitive heavy lifting.  When you lift objects, always use proper lifting technique, which includes: ? Bending your knees. ? Keeping the load close to your body. ? Avoiding twisting. General instructions  Use good posture. ? Avoid leaning forward while sitting. ? Avoid hunching over while standing.  Maintain a healthy weight. Excess weight puts extra stress on your back and makes it difficult to maintain good posture.  Wear supportive, comfortable shoes. Avoid wearing high heels.  Avoid sleeping on a mattress that is too soft or too hard. A mattress that is firm enough to support your back when you sleep may help to reduce your pain.  Keep all follow-up visits as told by your health care provider. This is important. Contact a health care provider if:  You have pain that wakes you up when you are sleeping.  You have pain that gets worse when you lie down.  Your pain is worse than you have experienced in the past.  Your pain lasts longer than 4 weeks.  You experience unexplained weight loss. Get help right away if:  You lose control  of your bowel or bladder (incontinence).  You have: ? Weakness in your lower back, pelvis, buttocks, or legs that gets worse. ? Redness or swelling of your back. ? A burning sensation when you urinate. This information is not intended to replace advice given to you by your health care provider. Make sure you discuss any questions you have with your health care provider. Document Released: 02/26/2001 Document Revised: 08/08/2015 Document Reviewed: 11/11/2014 Elsevier Interactive Patient Education  2018 Elsevier Inc.  

## 2017-04-08 ENCOUNTER — Ambulatory Visit (INDEPENDENT_AMBULATORY_CARE_PROVIDER_SITE_OTHER): Payer: Worker's Compensation | Admitting: Family Medicine

## 2017-04-08 ENCOUNTER — Telehealth: Payer: Self-pay | Admitting: Family Medicine

## 2017-04-08 ENCOUNTER — Ambulatory Visit: Payer: Worker's Compensation | Admitting: Physical Therapy

## 2017-04-08 ENCOUNTER — Encounter: Payer: Self-pay | Admitting: Family Medicine

## 2017-04-08 VITALS — BP 143/99 | HR 90 | Temp 97.0°F | Ht 62.5 in | Wt 193.4 lb

## 2017-04-08 DIAGNOSIS — M545 Low back pain: Secondary | ICD-10-CM

## 2017-04-08 DIAGNOSIS — M5442 Lumbago with sciatica, left side: Secondary | ICD-10-CM

## 2017-04-08 MED ORDER — GABAPENTIN 300 MG PO CAPS: 300.0000 mg | ORAL_CAPSULE | Freq: Three times a day (TID) | ORAL | 0 refills | Status: DC

## 2017-04-08 NOTE — Telephone Encounter (Signed)
Requesting work ristrictions form faxed to [phone]- faxed.

## 2017-04-08 NOTE — Telephone Encounter (Signed)
[email or web address]

## 2017-04-08 NOTE — Patient Instructions (Signed)
Great to meet you!  Try higher dose of gabapentin, start at night.  We will work on a referral to orthopedics for you.

## 2017-04-08 NOTE — Progress Notes (Signed)
   HPI  Patient presents today Worker's Comp. follow-up for left-sided low back pain.  Patient explains that her injury was on 01/29/2017, she was seen in our practice on 01/30/2017. She describes persistent left-sided low back pain that radiates intermittently down the left lower extremity to the foot.  She states that over the last week or so it has become more constant and she has had some right sided foot involvement as well.  She describes bilateral feet tingling associated with a left-sided low back pain and left-sided radiation down her posterior leg. She also states that it is now beginning to migrate more to the right towards her spine.  She denies any leg weakness, bowel or bladder dysfunction, or saddle anesthesia.  She has tried multiple medications including IM Toradol, IM Depo-Medrol, gabapentin 100 mg, Flexeril 5 mg, naproxen 500 mg. She is currently taking gabapentin 100 mg 3 times daily as needed, she denies any sedation from it. She states that she is having difficulty sleeping, Flexeril does also make her sleepy.  PMH: Smoking status noted ROS: Per HPI  Objective: BP (!) 143/99   Pulse 90   Temp (!) 97 F (36.1 C) (Oral)   Ht 5' 2.5" (1.588 m)   Wt 193 lb 6.4 oz (87.7 kg)   BMI 34.81 kg/m  Gen: NAD, alert, cooperative with exam HEENT: NCAT CV: RRR, good S1/S2, no murmur Resp: CTABL, no wheezes, non-labored Ext: No edema, warm Neuro: Alert and oriented, strength 5/5 in bilateral lower extremities, 1+ symmetric patellar tendon reflexes MSK Negative straight leg raise bilaterally, negative faber and Fadir testing BL hips + Tenderness to palpation of midline lumbar spine and bilateral paraspinal muscles  Assessment and plan:  #Low back pain with left-sided sciatica Patient with persistent symptoms now for 6-8 weeks.  She has had mild improvement with medications, she is also had physical therapy with mild improvement. Titrate gabapentin 300 mg, has had little  improvement with conservative therapy.  Possible progression of symptoms with bilateral feet symptoms now Refer to orthopedics- I appreciate their recommendations      Orders Placed This Encounter  Procedures  . Ambulatory referral to Orthopedic Surgery    Referral Priority:   Routine    Referral Type:   Surgical    Referral Reason:   Specialty Services Required    Requested Specialty:   Orthopedic Surgery    Number of Visits Requested:   1    Meds ordered this encounter  Medications  . gabapentin (NEURONTIN) 300 MG capsule    Sig: Take 1 capsule (300 mg total) by mouth 3 (three) times daily.    Dispense:  90 capsule    Refill:  0    Murtis SinkSam Bradshaw, MD [facility] 04/08/2017, 8:55 AM

## 2017-04-08 NOTE — Patient Instructions (Addendum)
  Taylor Jackson, PT, DPT [place] [phone]

## 2017-04-08 NOTE — Therapy (Signed)
[facility] [address] [place], [place], [address] Phone: [phone]   Fax:  [phone]  Physical Therapy Treatment  Patient Details  Name: Taylor Jackson MRN: [id number] Date of Birth: 03/09/1991 Referring Provider: Jannifer Rodney    Encounter Date: 04/08/2017  PT End of Session - 04/08/17 1000    Visit Number  2    Number of Visits  12    Date for PT Re-Evaluation  05/14/17    PT Start Time  0900    PT Stop Time  0945    PT Time Calculation (min)  45 min    Activity Tolerance  Patient tolerated treatment well    Behavior During Therapy  [facility] for tasks assessed/performed       Past Medical History:  Diagnosis Date  . Migraine     Past Surgical History:  Procedure Laterality Date  . TONSILLECTOMY      There were no vitals filed for this visit.  Subjective Assessment - 04/08/17 1004    Subjective  Patient reported feeling pain, 4-5/10 pain in Left low back/SI joint region. Patient reports pain is aggravated most with standing and lying down.    Limitations  Standing    Pain Score  5     Pain Location  Back    Pain Orientation  Left    Pain Descriptors / Indicators  Aching    Pain Type  Acute pain    Pain Onset  More than a month ago                      [facility] Adult PT Treatment/Exercise - 04/08/17 0001      Exercises   Exercises  Lumbar      Lumbar Exercises: Stretches   Piriformis Stretch  Left;3 reps;30 seconds      Modalities   Modalities  Ultrasound Combo      Ultrasound   Ultrasound Location  Left iliolumbar ligament/SI Joint    Ultrasound Parameters  1.5w/c2    Ultrasound Goals  Pain             PT Education - 04/08/17 1008    Education provided  Yes    Education Details  Draw in, Left Piriformis Stretch    Person(s) Educated  Patient    Methods  Demonstration;Explanation;Handout    Comprehension  Verbalized understanding;Returned demonstration          PT Long Term  Goals - 04/02/17 1225      PT LONG TERM GOAL #1   Title  Independent with a HEP.    Time  6    Period  Weeks    Status  New      PT LONG TERM GOAL #2   Title  Patient sit 30 minutes with equal weight bearing over ischial tuberosites with pain not > 2/10.    Time  6    Period  Weeks    Status  New      PT LONG TERM GOAL #3   Title  Eliminate left LE symptoms.    Time  6    Period  Weeks    Status  New      PT LONG TERM GOAL #4   Title  Perform regular workshift with pain not > 2-3/10.    Time  6    Period  Weeks    Status  New            Plan - 04/08/17  1009    Clinical Impression Statement  Patient was able to complete exercises with no increase of pain. Patient very tender to STW/M at L iliolumbar ligament/SI Joint and centrally at L5/S1 of the spine. Patient reported "still feeling it but it's not as bad" at end of session. Patient was instructed on draw in exercise and piriformis stretch, see patient education section. Patient verbalized and demonstrated understading. No adverse affects were found upon removal of modalities.     Rehab Potential  Excellent    PT Frequency  2x / week    PT Duration  6 weeks    PT Treatment/Interventions  ADLs/Self Care Home Management;Electrical Stimulation;Moist Heat;Traction;Ultrasound;Therapeutic exercise;Therapeutic activities;Patient/family education;Manual techniques    PT Next Visit Plan  Continue with modalities for pain relief as needed, begin stretching and stabilization exercises to decrease pain and improve overall function.    Consulted and Agree with Plan of Care  Patient       Patient will benefit from skilled therapeutic intervention in order to improve the following deficits and impairments:  Decreased activity tolerance, Pain, Decreased range of motion  Visit Diagnosis: Acute left-sided low back pain, with sciatica presence unspecified     Problem List Patient Active Problem List   Diagnosis Date Noted  .  Headache(784.0) 10/15/2012  . Unspecified tinnitus 10/15/2012  . 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 Pseudotumor cerebri 10/15/2012  . Low back pain 10/15/2012   Guss BundeKrystle Rhiannon Sassaman, PT, DPT 04/08/2017, 10:22 AM  [facility] [address] [place], [place], [address] Phone: [phone]   Fax:  [phone]  Name: Taylor Bonddriana Jackson MRN: [id number] Date of Birth: 1990-10-11

## 2017-04-14 ENCOUNTER — Ambulatory Visit: Payer: Self-pay | Admitting: Family

## 2017-04-16 ENCOUNTER — Ambulatory Visit: Payer: Worker's Compensation | Admitting: Physical Therapy

## 2017-04-16 ENCOUNTER — Encounter: Payer: Self-pay | Admitting: Physical Therapy

## 2017-04-16 DIAGNOSIS — M545 Low back pain: Secondary | ICD-10-CM

## 2017-04-16 NOTE — Patient Instructions (Signed)
Pelvic Tilt: Posterior - Legs Bent (Supine)  Tighten stomach and flatten back by rolling pelvis down. Hold _10___ seconds. Relax. Repeat _10-30___ times per set. Do __2__ sets per session. Do _2___ sessions per day.   Bent Leg Lift (Hook-Lying)  Tighten stomach and slowly raise right leg _5___ inches from floor. Keep trunk rigid. Hold _3___ seconds. Repeat _10___ times per set. Do ___2-3_ sets per session. Do __2__ sessions per day.   Straight Leg Raise  Tighten stomach and slowly raise locked right leg __4__ inches from floor. Repeat __10-30__ times per set. Do __2__ sets per session. Do __2__ sessions per day.  Bridging  Slowly raise buttocks from floor, keeping stomach tight. Repeat _10___ times per set. Do __2__ sets per session. Do __2__ sessions per day.   Brushing Teeth    Place one foot on ledge and one hand on counter. Bend other knee slightly to keep back straight.  Copyright  VHI. All rights reserved.  Refrigerator   Squat with knees apart to reach lower shelves and drawers.   Copyright  VHI. All rights reserved.  Laundry Basket   Squat down and hold basket close to stand. Use leg muscles to do the work.   Copyright  VHI. All rights reserved.  Housework - Vacuuming   Hold the vacuum with arm held at side. Step back and forth to move it, keeping head up. Avoid twisting.   Copyright  VHI. All rights reserved.  Housework - Wiping   Position yourself as close as possible to reach work surface. Avoid straining your back.   Copyright  VHI. All rights reserved.  Gardening - Mowing   Keep arms close to sides and walk with lawn mower.   Copyright  VHI. All rights reserved.  Sleeping on Side   Place pillow between knees. Use cervical support under neck and a roll around waist as needed.   Copyright  VHI. All rights reserved.  Log Roll   Lying on back, bend left knee and place left arm across chest. Roll all in one movement to the  right. Reverse to roll to the left. Always move as one unit.   Copyright  VHI. All rights reserved.  Stand to Sit / Sit to Stand   To sit: Bend knees to lower self onto front edge of chair, then scoot back on seat. To stand: Reverse sequence by placing one foot forward, and scoot to front of seat. Use rocking motion to stand up.  Copyright  VHI. All rights reserved.  Posture - Standing   Good posture is important. Avoid slouching and forward head thrust. Maintain curve in low back and align ears over shoul- ders, hips over ankles.   Copyright  VHI. All rights reserved.  Posture - Sitting   Sit upright, head facing forward. Try using a roll to support lower back. Keep shoulders relaxed, and avoid rounded back. Keep hips level with knees. Avoid crossing legs for long periods.   Copyright  VHI. All rights reserved.  Computer Work   Position work to face forward. Use proper work and seat height. Keep shoulders back and down, wrists straight, and elbows at right angles. Use chair that provides full back support. Add footrest and lumbar roll as needed.   Copyright  VHI. All rights reserved.   

## 2017-04-16 NOTE — Therapy (Addendum)
[facility] [place], [place], [address] Phone: [phone]   Fax:  [phone]  Physical Therapy Treatment  Patient Details  Name: Taylor Jackson MRN: [id number] Date of Birth: [birth year] Referring Provider: Evelina Dun    Encounter Date: 04/16/2017  PT End of Session - 04/16/17 1027    Visit Number  3    Number of Visits  12    Date for PT Re-Evaluation  05/14/17    PT Start Time  0944    PT Stop Time  1043    PT Time Calculation (min)  59 min    Activity Tolerance  Patient tolerated treatment well    Behavior During Therapy  [facility] for tasks assessed/performed       Past Medical History:  Diagnosis Date  . Migraine     Past Surgical History:  Procedure Laterality Date  . TONSILLECTOMY      There were no vitals filed for this visit.  Subjective Assessment - 04/16/17 0952    Subjective  Patient reported minimal progress    Limitations  Standing    How long can you stand comfortably?  20 minutes.    Patient Stated Goals  Get back to normal.    Currently in Pain?  Yes    Pain Score  5     Pain Location  Back    Pain Orientation  Left    Pain Descriptors / Indicators  Aching;Discomfort    Pain Onset  More than a month ago    Pain Frequency  Constant    Aggravating Factors   squatting and certain movements (work activities)    Pain Relieving Factors  rest                      [facility] Adult PT Treatment/Exercise - 04/16/17 0001      Self-Care   Self-Care  ADL's;Lifting;Posture;Other Self-Care Comments    Other Self-Care Comments   All posture techniques HEP provided       Lumbar Exercises: Supine   Ab Set  20 reps;3 seconds    Glut Set  20 reps;3 seconds    Bent Knee Raise  20 reps;3 seconds    Bridge  20 reps;3 seconds    Single Leg Bridge  3 seconds 2x10      Moist Heat Therapy   Number Minutes Moist Heat  15 Minutes    Moist Heat Location  Lumbar Spine      Electrical Stimulation    Electrical Stimulation Location  Left SIJ region.    Electrical Stimulation Action  premod    Electrical Stimulation Parameters  80-'150hz'$  x58mn    Electrical Stimulation Goals  Pain      Ultrasound   Ultrasound Location  left SI joint     Ultrasound Parameters  1.5w/cm2/50%/186m x1027m   Ultrasound Goals  Pain             PT Education - 04/16/17 1004    Education provided  Yes    Education Details  HEP posture techniques and core activation progression    Person(s) Educated  Patient    Methods  Explanation;Demonstration;Handout    Comprehension  Verbalized understanding;Returned demonstration          PT Long Term Goals - 04/16/17 1029      PT LONG TERM GOAL #1   Title  Independent with a HEP.    Time  6    Period  Weeks  Status  Achieved      PT LONG TERM GOAL #2   Title  Patient sit 30 minutes with equal weight bearing over ischial tuberosites with pain not > 2/10.    Time  6    Period  Weeks    Status  On-going      PT LONG TERM GOAL #3   Title  Eliminate left LE symptoms.    Time  6    Period  Weeks    Status  On-going      PT LONG TERM GOAL #4   Title  Perform regular workshift with pain not > 2-3/10.    Time  6    Period  Weeks    Status  On-going            Plan - 04/16/17 1030    Clinical Impression Statement  Patient tolerated treatment well today. Today focused on education with posture awareness techniques for work and home as well as core activation with strengthening progression, HEP provided for all with good understanding. Patient has reported minimal relief overall yet only 3 visits thus far. Patient has reported increased discomfort after work activities and less pain with rest. Today patient met LTG #1 for self HEP yet other goals ongoing due to pain deficts. Patient going to orthopedic MD next week. Will discuss further POC.    Rehab Potential  Excellent    PT Frequency  2x / week    PT Duration  6 weeks    PT  Treatment/Interventions  ADLs/Self Care Home Management;Electrical Stimulation;Moist Heat;Traction;Ultrasound;Therapeutic exercise;Therapeutic activities;Patient/family education;Manual techniques    PT Next Visit Plan  Continue with stretching and stabilization exercises to decrease pain and improve overall function and modalities PRN    Consulted and Agree with Plan of Care  Patient       Patient will benefit from skilled therapeutic intervention in order to improve the following deficits and impairments:  Decreased activity tolerance, Pain, Decreased range of motion  Visit Diagnosis: Acute left-sided low back pain, with sciatica presence unspecified     Problem List Patient Active Problem List   Diagnosis Date Noted  . Headache(784.0) 10/15/2012  . Unspecified tinnitus 10/15/2012  . 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 Pseudotumor cerebri 10/15/2012  . Low back pain 10/15/2012    Phillips Climes, PTA 04/16/2017, 10:43 AM  [facility] [place], [place], [address] Phone: [phone]   Fax:  [phone]  Name: Taylor Jackson MRN: [id number] Date of Birth: 01-12-1991  PHYSICAL THERAPY DISCHARGE SUMMARY  Visits from Start of Care: 3.  Current functional level related to goals / functional outcomes: See above.   Remaining deficits: See below.   Education / Equipment: HEP. Plan: Patient agrees to discharge.  Patient goals were not met. Patient is being discharged due to not returning since the last visit.  ?????         Mali Applegate MPT

## 2017-04-17 ENCOUNTER — Other Ambulatory Visit: Payer: Self-pay | Admitting: Family

## 2017-04-18 ENCOUNTER — Other Ambulatory Visit: Payer: Self-pay | Admitting: Family

## 2017-05-12 ENCOUNTER — Other Ambulatory Visit: Payer: Self-pay | Admitting: Family Medicine

## 2017-06-10 ENCOUNTER — Encounter: Payer: Self-pay | Admitting: Physician Assistant

## 2017-06-10 ENCOUNTER — Ambulatory Visit: Payer: BLUE CROSS/BLUE SHIELD | Admitting: Physician Assistant

## 2017-06-10 VITALS — BP 127/87 | HR 88 | Temp 98.6°F | Ht 62.5 in | Wt 195.0 lb

## 2017-06-10 DIAGNOSIS — F419 Anxiety disorder, unspecified: Secondary | ICD-10-CM

## 2017-06-10 DIAGNOSIS — R300 Dysuria: Secondary | ICD-10-CM | POA: Diagnosis not present

## 2017-06-10 DIAGNOSIS — N3000 Acute cystitis without hematuria: Secondary | ICD-10-CM | POA: Diagnosis not present

## 2017-06-10 LAB — URINALYSIS, COMPLETE
BILIRUBIN UA: NEGATIVE
GLUCOSE, UA: NEGATIVE
KETONES UA: NEGATIVE
LEUKOCYTES UA: NEGATIVE
Nitrite, UA: NEGATIVE
PH UA: 8.5 — AB (ref 5.0–7.5)
PROTEIN UA: NEGATIVE
RBC UA: NEGATIVE
SPEC GRAV UA: 1.015 (ref 1.005–1.030)
UUROB: 1 mg/dL (ref 0.2–1.0)

## 2017-06-10 LAB — MICROSCOPIC EXAMINATION
BACTERIA UA: NONE SEEN
Epithelial Cells (non renal): NONE SEEN /hpf (ref 0–10)
RBC, UA: NONE SEEN /hpf (ref 0–2)
RENAL EPITHEL UA: NONE SEEN /HPF

## 2017-06-10 MED ORDER — SULFAMETHOXAZOLE-TRIMETHOPRIM 800-160 MG PO TABS
1.0000 | ORAL_TABLET | Freq: Two times a day (BID) | ORAL | 0 refills | Status: DC
Start: 2017-06-10 — End: 2018-02-23

## 2017-06-10 MED ORDER — ALPRAZOLAM 0.5 MG PO TABS: 0.5000 mg | ORAL_TABLET | Freq: Every evening | ORAL | 0 refills | Status: DC | PRN

## 2017-06-10 MED ORDER — ESCITALOPRAM OXALATE 10 MG PO TABS: 10.0000 mg | ORAL_TABLET | Freq: Every day | ORAL | 5 refills | Status: AC

## 2017-06-10 NOTE — Progress Notes (Signed)
BP 127/87   Pulse 88   Temp 98.6 F (37 C) (Oral)   Ht 5' 2.5" (1.588 m)   Wt 195 lb (88.5 kg)   BMI 35.10 kg/m    Subjective:    Patient ID: Taylor Jackson, female    DOB: [birth year], 27 y.o.   MRN: [id number]  HPI: Taylor Jackson is a 27 y.o. female presenting on 06/10/2017 for New Patient (Initial Visit); Urinary Tract Infection (went to ER last night ); Abdominal Pain; and Flank Pain This patient has had several days of dysuria, frequency and nocturia. There is also pain over the bladder in the suprapubic region, no back pain. Denies leakage or hematuria.  Denies fever or chills. No pain in flank area.  Depression screen [facility] 2/9 06/10/2017 04/08/2017 04/02/2017 03/24/2017 02/20/2017  Decreased Interest 1 0 0 0 0  Down, Depressed, Hopeless 2 0 0 0 0  PHQ - 2 Score 3 0 0 0 0  Altered sleeping 3 - - - -  Tired, decreased energy 3 - - - -  Change in appetite 2 - - - -  Feeling bad or failure about yourself  1 - - - -  Trouble concentrating 2 - - - -  Moving slowly or fidgety/restless 1 - - - -  Suicidal thoughts 0 - - - -  PHQ-9 Score 15 - - - -     Past Medical History:  Diagnosis Date  . Migraine    Relevant past medical, surgical, family and social history reviewed and updated as indicated. Interim medical history since our last visit reviewed. Allergies and medications reviewed and updated. DATA REVIEWED: CHART IN EPIC  Family History reviewed for pertinent findings.  Review of Systems  Constitutional: Negative.   HENT: Negative.   Eyes: Negative.   Respiratory: Negative.   Gastrointestinal: Negative.   Genitourinary: Positive for difficulty urinating, dysuria and urgency. Negative for flank pain.    Allergies as of 06/10/2017   No Known Allergies     Medication List        Accurate as of 06/10/17  3:22 PM. Always use your most recent med list.          ALPRAZolam 0.5 MG tablet Commonly known as:  XANAX Take 1 tablet (0.5 mg total) by mouth at bedtime as  needed for anxiety.   escitalopram 10 MG tablet Commonly known as:  LEXAPRO Take 1 tablet (10 mg total) by mouth daily.   ibuprofen 800 MG tablet Commonly known as:  ADVIL,MOTRIN TAKE 1 TABLET(S) 3 TIMES A DAY WITH MEALS.   LYRICA 50 MG capsule Generic drug:  pregabalin TAKE 1 CAPSULE BY MOUTH TWICE A DAY AS DIRECTED WAITING ON W/C TO CALL BACK   meloxicam 15 MG tablet Commonly known as:  MOBIC TAKE 1 TABLET BY MOUTH EVERY DAY WITH MEALS   sulfamethoxazole-trimethoprim 800-160 MG tablet Commonly known as:  BACTRIM DS Take 1 tablet by mouth 2 (two) times daily.          Objective:    BP 127/87   Pulse 88   Temp 98.6 F (37 C) (Oral)   Ht 5' 2.5" (1.588 m)   Wt 195 lb (88.5 kg)   BMI 35.10 kg/m   No Known Allergies  Wt Readings from Last 3 Encounters:  06/10/17 195 lb (88.5 kg)  04/08/17 193 lb 6.4 oz (87.7 kg)  04/02/17 194 lb 6.4 oz (88.2 kg)    Physical Exam  Constitutional: She is oriented to person,  place, and time. She appears well-developed and well-nourished.  HENT:  Head: Normocephalic and atraumatic.  Eyes: Pupils are equal, round, and reactive to light. Conjunctivae are normal.  Cardiovascular: Normal rate, regular rhythm, normal heart sounds and intact distal pulses.  Pulmonary/Chest: Effort normal and breath sounds normal.  Abdominal: Soft. Bowel sounds are normal. She exhibits no distension and no mass. There is tenderness in the suprapubic area. There is no rebound, no guarding and no CVA tenderness.  Neurological: She is alert and oriented to person, place, and time. She has normal reflexes.  Skin: Skin is warm and dry. No rash noted.  Psychiatric: She has a normal mood and affect. Her behavior is normal. Judgment and thought content normal.        Assessment & Plan:   1. Dysuria - Urine Culture - Urinalysis, Complete  2. Acute cystitis without hematuria  3. Anxiety Start lexapro and alprazolam as needed   Continue all other  maintenance medications as listed above.  Follow up plan: Return in about 1 month (around 07/08/2017) for recheck, cancel other appt.  Educational handout given for survey  Remus LofflerAngel S. Cyntia Staley PA-C [facility] [address]  [place], [place] [address] [phone]   06/10/2017, 3:22 PM

## 2017-06-11 LAB — URINE CULTURE: ORGANISM ID, BACTERIA: NO GROWTH

## 2017-06-25 ENCOUNTER — Ambulatory Visit: Payer: Self-pay | Admitting: Physician Assistant

## 2017-07-10 ENCOUNTER — Ambulatory Visit: Payer: Self-pay | Admitting: Physician Assistant

## 2017-07-14 ENCOUNTER — Encounter: Payer: Self-pay | Admitting: Physician Assistant

## 2017-09-08 ENCOUNTER — Other Ambulatory Visit: Payer: Self-pay | Admitting: Orthopedic Surgery

## 2017-09-08 DIAGNOSIS — M533 Sacrococcygeal disorders, not elsewhere classified: Secondary | ICD-10-CM

## 2017-10-02 ENCOUNTER — Ambulatory Visit
Admission: RE | Admit: 2017-10-02 | Discharge: 2017-10-02 | Disposition: A | Discharge status: Home / Self Care | Payer: Worker's Compensation | Source: Ambulatory Visit | Attending: Orthopedic Surgery | Admitting: Orthopedic Surgery

## 2017-10-02 DIAGNOSIS — M533 Sacrococcygeal disorders, not elsewhere classified: Secondary | ICD-10-CM

## 2018-02-23 ENCOUNTER — Other Ambulatory Visit: Payer: Self-pay

## 2018-02-23 ENCOUNTER — Inpatient Hospital Stay (HOSPITAL_COMMUNITY)
Admission: AD | Admit: 2018-02-23 | Discharge: 2018-02-23 | Disposition: A | Discharge status: Home / Self Care | Payer: Medicaid Other | Attending: Obstetrics & Gynecology | Admitting: Obstetrics & Gynecology

## 2018-02-23 ENCOUNTER — Inpatient Hospital Stay (HOSPITAL_COMMUNITY): Payer: Medicaid Other

## 2018-02-23 ENCOUNTER — Encounter (HOSPITAL_COMMUNITY): Payer: Self-pay | Admitting: *Deleted

## 2018-02-23 DIAGNOSIS — R197 Diarrhea, unspecified: Secondary | ICD-10-CM | POA: Diagnosis not present

## 2018-02-23 DIAGNOSIS — R109 Unspecified abdominal pain: Secondary | ICD-10-CM

## 2018-02-23 DIAGNOSIS — O26899 Other specified pregnancy related conditions, unspecified trimester: Secondary | ICD-10-CM

## 2018-02-23 DIAGNOSIS — Z3A01 Less than 8 weeks gestation of pregnancy: Secondary | ICD-10-CM | POA: Insufficient documentation

## 2018-02-23 DIAGNOSIS — O26891 Other specified pregnancy related conditions, first trimester: Secondary | ICD-10-CM | POA: Diagnosis present

## 2018-02-23 DIAGNOSIS — R112 Nausea with vomiting, unspecified: Secondary | ICD-10-CM | POA: Diagnosis present

## 2018-02-23 HISTORY — DX: Anxiety disorder, unspecified: F41.9

## 2018-02-23 LAB — COMPREHENSIVE METABOLIC PANEL
ALBUMIN: 3.6 g/dL (ref 3.5–5.0)
ALK PHOS: 54 U/L (ref 38–126)
ALT: 34 U/L (ref 0–44)
ANION GAP: 10 (ref 5–15)
AST: 29 U/L (ref 15–41)
BILIRUBIN TOTAL: 1.4 mg/dL — AB (ref 0.3–1.2)
BUN: 10 mg/dL (ref 6–20)
CALCIUM: 8.5 mg/dL — AB (ref 8.9–10.3)
CO2: 22 mmol/L (ref 22–32)
Chloride: 100 mmol/L (ref 98–111)
Creatinine, Ser: 0.71 mg/dL (ref 0.44–1.00)
GFR calc Af Amer: >60 mL/min (ref >60)
GFR calc non Af Amer: >60 mL/min (ref >60)
GLUCOSE: 83 mg/dL (ref 70–99)
POTASSIUM: 4.9 mmol/L (ref 3.5–5.1)
Sodium: 132 mmol/L — ABNORMAL LOW (ref 135–145)
Total Protein: 6.6 g/dL (ref 6.5–8.1)

## 2018-02-23 LAB — URINALYSIS, ROUTINE W REFLEX MICROSCOPIC
BILIRUBIN URINE: NEGATIVE
Glucose, UA: NEGATIVE mg/dL
HGB URINE DIPSTICK: NEGATIVE
Ketones, ur: NEGATIVE mg/dL
Leukocytes, UA: NEGATIVE
Nitrite: NEGATIVE
PH: 6 (ref 5.0–8.0)
Protein, ur: NEGATIVE mg/dL
SPECIFIC GRAVITY, URINE: 1.018 (ref 1.005–1.030)

## 2018-02-23 LAB — CBC
HEMATOCRIT: 38.2 % (ref 36.0–46.0)
HEMOGLOBIN: 12.6 g/dL (ref 12.0–15.0)
MCH: 28.3 pg (ref 26.0–34.0)
MCHC: 33 g/dL (ref 30.0–36.0)
MCV: 85.8 fL (ref 80.0–100.0)
Platelets: 326 10*3/uL (ref 150–400)
RBC: 4.45 MIL/uL (ref 3.87–5.11)
RDW: 13 % (ref 11.5–15.5)
WBC: 15.6 10*3/uL — AB (ref 4.0–10.5)
nRBC: 0 % (ref 0.0–0.2)

## 2018-02-23 LAB — WET PREP, GENITAL
CLUE CELLS WET PREP: NONE SEEN
Sperm: NONE SEEN
TRICH WET PREP: NONE SEEN
YEAST WET PREP: NONE SEEN

## 2018-02-23 LAB — POCT PREGNANCY, URINE: Preg Test, Ur: POSITIVE — AB

## 2018-02-23 LAB — HCG, QUANTITATIVE, PREGNANCY: HCG, BETA CHAIN, QUANT, S: 70592 m[IU]/mL — AB (ref <5)

## 2018-02-23 LAB — LIPASE, BLOOD: Lipase: 28 U/L (ref 11–51)

## 2018-02-23 MED ORDER — ONDANSETRON 4 MG PO TBDP: 4.0000 mg | ORAL_TABLET | Freq: Three times a day (TID) | ORAL | 0 refills | Status: AC | PRN

## 2018-02-23 MED ORDER — PROMETHAZINE HCL 25 MG/ML IJ SOLN
25.0000 mg | Freq: Four times a day (QID) | INTRAMUSCULAR | Status: DC | PRN
  Administered 2018-02-23: 25 mg via INTRAVENOUS
  Filled 2018-02-23: qty 1

## 2018-02-23 MED ORDER — SODIUM CHLORIDE 0.9 % IV SOLN
8.0000 mg | Freq: Once | INTRAVENOUS | Status: AC
  Administered 2018-02-23: 8 mg via INTRAVENOUS
  Filled 2018-02-23: qty 4

## 2018-02-23 MED ORDER — RANITIDINE HCL 150 MG PO TABS: 150.0000 mg | ORAL_TABLET | Freq: Two times a day (BID) | ORAL | 0 refills | Status: AC

## 2018-02-23 MED ORDER — LACTATED RINGERS IV BOLUS
1000.0000 mL | Freq: Once | INTRAVENOUS | Status: AC
  Administered 2018-02-23: 1000 mL via INTRAVENOUS

## 2018-02-23 NOTE — MAU Provider Note (Addendum)
History     CSN: [id number]  Arrival date and time: 02/23/18 1526   First Provider Initiated Contact with Patient 02/23/18 1603      Chief Complaint  Patient presents with  . Emesis  . Nausea  . Diarrhea   27yo G1 with SIUP at [redacted]w[redacted]d based off of sure LMP. She presents today with n/v/d since last night.c/o lower abdominal cramping that comes and goes. No VB. She is unable to keep liquids/food down. Reports vomiting >10 times today. Reports that prior to having diarrhea she took a laxative yesterday because she was constipated. Nothing has aggravated or alleviated sx including taking Diclegis that was prescribed to her from a different ED visit for N/V. No sick contacts.       OB History    Gravida  1   Para      Term      Preterm      AB      Living        SAB      TAB      Ectopic      Multiple      Live Births              Past Medical History:  Diagnosis Date  . Anxiety   . Migraine     Past Surgical History:  Procedure Laterality Date  . TONSILLECTOMY      Family History  Problem Relation Age of Onset  . Lupus Mother   . Hypertension Father   . Lupus Unknown   . Asthma Unknown   . Hypertension Unknown   . Cancer Unknown   . Crohn's disease Unknown   . Kidney disease Unknown   . Diabetes Paternal Uncle     Social History   Tobacco Use  . Smoking status: Never Smoker  . Smokeless tobacco: Never Used  Substance Use Topics  . Alcohol use: Not Currently    Comment: Beer or wine several times monthly.  . Drug use: No    Allergies: No Known Allergies  Meds: prenatal vitamins, diclegis   Review of Systems  Constitutional: Positive for fatigue. Negative for chills, diaphoresis and fever.  Respiratory: Negative for cough and shortness of breath.   Cardiovascular: Negative for chest pain.  Gastrointestinal: Positive for abdominal pain, diarrhea, nausea and vomiting. Negative for abdominal distention and constipation.  Genitourinary:  Negative for dysuria, flank pain, vaginal bleeding and vaginal discharge.  Musculoskeletal: Negative for back pain.  Neurological: Negative for light-headedness and headaches.   Physical Exam   Blood pressure 122/60, pulse 76, temperature 98 F (36.7 C), resp. rate 15, height 5\' 2"  (1.575 m), weight 93 kg, last menstrual period 01/02/2018.  Physical Exam  Constitutional: She is oriented to person, place, and time. She appears well-developed.  HENT:  Head: Normocephalic.  Neck: Normal range of motion.  Cardiovascular: Normal rate.  Respiratory: Effort normal. No respiratory distress.  GI: Soft. Normal appearance. There is no tenderness.  Genitourinary: Vagina normal. Uterus is not tender. Cervix exhibits no motion tenderness. Right adnexum displays no tenderness. Left adnexum displays no tenderness.  Genitourinary Comments: White clumpy cervical discharge noted   Musculoskeletal: Normal range of motion.  Neurological: She is alert and oriented to person, place, and time.  Skin: Skin is warm and dry.  Psychiatric: She has a normal mood and affect.   Results for orders placed or performed during the hospital encounter of 02/23/18 (from the past 48 hour(s))  Urinalysis, Routine w reflex  microscopic     Status: Abnormal   Collection Time: 02/23/18  4:03 PM  Result Value Ref Range   Color, Urine YELLOW YELLOW   APPearance HAZY (A) CLEAR   Specific Gravity, Urine 1.018 1.005 - 1.030   pH 6.0 5.0 - 8.0   Glucose, UA NEGATIVE NEGATIVE mg/dL   Hgb urine dipstick NEGATIVE NEGATIVE   Bilirubin Urine NEGATIVE NEGATIVE   Ketones, ur NEGATIVE NEGATIVE mg/dL   Protein, ur NEGATIVE NEGATIVE mg/dL   Nitrite NEGATIVE NEGATIVE   Leukocytes, UA NEGATIVE NEGATIVE    Comment: Performed at [facility], [address] [place], [place] [address]  Pregnancy, urine POC     Status: Abnormal   Collection Time: 02/23/18  4:05 PM  Result Value Ref Range   Preg Test, Ur POSITIVE (A) NEGATIVE     Comment:        THE SENSITIVITY OF THIS METHODOLOGY IS >24 mIU/mL   CBC     Status: Abnormal   Collection Time: 02/23/18  5:22 PM  Result Value Ref Range   WBC 15.6 (H) 4.0 - 10.5 K/uL   RBC 4.45 3.87 - 5.11 MIL/uL   Hemoglobin 12.6 12.0 - 15.0 g/dL   HCT 69.638.2 29.536.0 - 28.446.0 %   MCV 85.8 80.0 - 100.0 fL   MCH 28.3 26.0 - 34.0 pg   MCHC 33.0 30.0 - 36.0 g/dL   RDW 13.213.0 44.011.5 - 10.215.5 %   Platelets 326 150 - 400 K/uL   nRBC 0.0 0.0 - 0.2 %    Comment: Performed at [facility], [address] [place], [place] [address]  Comprehensive metabolic panel     Status: Abnormal   Collection Time: 02/23/18  5:22 PM  Result Value Ref Range   Sodium 132 (L) 135 - 145 mmol/L   Potassium 4.9 3.5 - 5.1 mmol/L   Chloride 100 98 - 111 mmol/L   CO2 22 22 - 32 mmol/L   Glucose, Bld 83 70 - 99 mg/dL   BUN 10 6 - 20 mg/dL   Creatinine, Ser 6.440.71 0.44 - 1.00 mg/dL   Calcium 8.5 (L) 8.9 - 10.3 mg/dL   Total Protein 6.6 6.5 - 8.1 g/dL   Albumin 3.6 3.5 - 5.0 g/dL   AST 29 15 - 41 U/L   ALT 34 0 - 44 U/L   Alkaline Phosphatase 54 38 - 126 U/L   Total Bilirubin 1.4 (H) 0.3 - 1.2 mg/dL   GFR calc non Af Amer >60 >60 mL/min   GFR calc Af Amer >60 >60 mL/min   Anion gap 10 5 - 15    Comment: Performed at [facility], [address] [place], [place] [address]  Lipase, blood     Status: None   Collection Time: 02/23/18  5:22 PM  Result Value Ref Range   Lipase 28 11 - 51 U/L    Comment: Performed at [facility], [address] [place], [place] [address]  hCG, quantitative, pregnancy     Status: Abnormal   Collection Time: 02/23/18  5:22 PM  Result Value Ref Range   hCG, Beta Chain, Quant, S 70,592 (H) <5 mIU/mL    Comment:          GEST. AGE      CONC.  (mIU/mL)   <=1 WEEK        5 - 50     2 WEEKS       50 - 500     3 WEEKS  100 - 10,000     4 WEEKS     1,000 - 30,000     5 WEEKS     3,500 - 115,000   6-8 WEEKS     12,000 - 270,000    12 WEEKS     15,000 - 220,000         FEMALE AND NON-PREGNANT FEMALE:     LESS THAN 5 mIU/mL Performed at [facility], [address], [place], [place] [address]   Wet prep, genital     Status: Abnormal   Collection Time: 02/23/18  6:10 PM  Result Value Ref Range   Yeast Wet Prep HPF POC NONE SEEN NONE SEEN   Trich, Wet Prep NONE SEEN NONE SEEN   Clue Cells Wet Prep HPF POC NONE SEEN NONE SEEN   WBC, Wet Prep HPF POC MODERATE (A) NONE SEEN    Comment: MANY BACTERIA SEEN   Sperm NONE SEEN     Comment: Performed at [facility], [address] [place], [place] [address]   US Ob Less Than 14 Weeks With Ob Transvaginal  Result Date: 02/23/2018 CLINICAL DATA:  Abdominal pain in first-trimester pregnancy EXAM: OBSTETRIC <14 WK [name] AND TRANSVAGINAL OB US TECHNIQUE: Both transabdominal and transvaginal ultrasound examinations were performed for complete evaluation of the gestation as well as the maternal uterus, adnexal regions, and pelvic cul-de-sac. Transvaginal technique was performed to assess early pregnancy. COMPARISON:  02/11/2018 FINDINGS: Intrauterine gestational sac: Single Yolk sac:  Visualized. Embryo:  Visualized. Cardiac Activity: Visualized. Heart Rate: 148 bpm CRL:  9.9 mm   7 w   0 d                  [name] EDC: 10/13/2018 Subchorionic hemorrhage:  None visualized. Maternal uterus/adnexae: Corpus luteum on the left. Unremarkable right ovary. IMPRESSION: Single living intrauterine pregnancy measuring 7 weeks. No abnormal finding. Electronically Signed   By: Marnee Spring M.D.   On: 02/23/2018 19:54   MAU Course  Procedures Urinalysis  CBC CMP IV hydration 1 liter LR  Phenergan 25mg  IV GC/CT culture  Transvaginal US  Wet mount   MDM Labs and [name] ordered. Normal IUP on [name]. Labs pending Continues to feel nauseated, Zofran ordered Transfer of care to [place], Rober Minion, CNM  02/23/2018 8:12 PM   Patient feeling better after Zofran dose -- requesting refill  Assessment and Plan  Nausea vomiting  and diarrhea  - Rx for Zofran 4 mg ODT every 8 hrs prn N/V & Zantac 150 mg BID  - Information provided on morning sickness  - Discharge patient - Keep scheduled appt w/ [facility] on 03/05/18 - Patient verbalized an understanding of the plan of care and agrees.   Raelyn Mora, CNM  02/23/2018 10:29 PM

## 2018-02-23 NOTE — MAU Note (Signed)
Pt presents to MAU with complaints of N/V/D since yesterday. Denies any VB. Lower abdominal cramping

## 2018-02-24 LAB — GC/CHLAMYDIA PROBE AMP (~~LOC~~) NOT AT ARMC
Chlamydia: NEGATIVE
Neisseria Gonorrhea: NEGATIVE

## 2018-03-02 ENCOUNTER — Other Ambulatory Visit: Payer: Self-pay

## 2018-06-24 ENCOUNTER — Other Ambulatory Visit: Payer: Self-pay | Admitting: Obstetrics and Gynecology

## 2018-07-02 ENCOUNTER — Other Ambulatory Visit: Payer: Self-pay | Admitting: Physician Assistant

## 2019-01-01 ENCOUNTER — Encounter (HOSPITAL_COMMUNITY): Payer: Self-pay

## 2019-03-22 IMAGING — US US OB < 14 WEEKS - US OB TV
1 series · 15 of 28 positions shown · non-contrast
Comparison: 02/11/2018

CLINICAL DATA: Abdominal pain in first-trimester pregnancy

EXAM:
OBSTETRIC <14 WK US AND TRANSVAGINAL OB US
TECHNIQUE: Both transabdominal and transvaginal ultrasound examinations were
performed for complete evaluation of the gestation as well as the
maternal uterus, adnexal regions, and pelvic cul-de-sac.
Transvaginal technique was performed to assess early pregnancy.

[Series 1: us ob < 14 weeks - us ob tv · 15 of 67 slices shown]
[im 1/67]
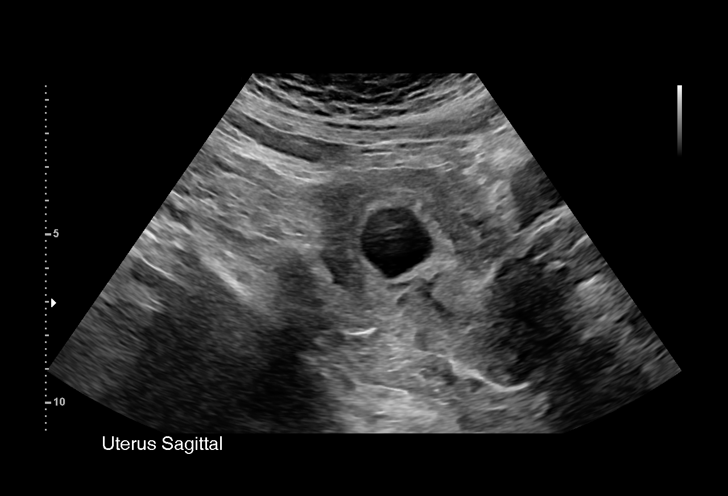
[im 5/67]
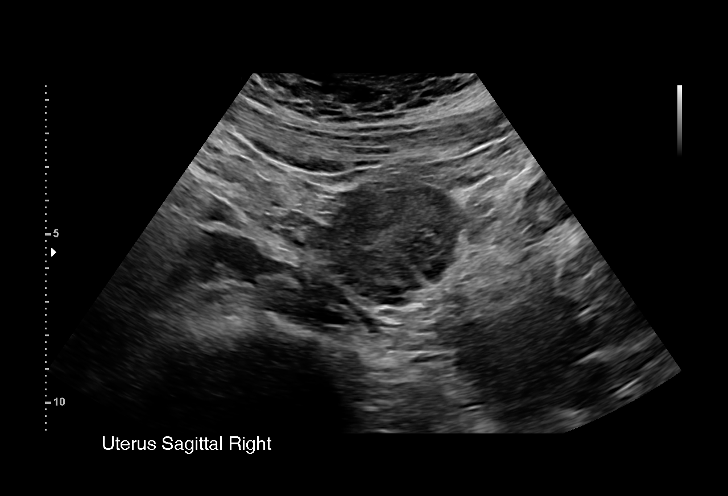
[im 10/67]
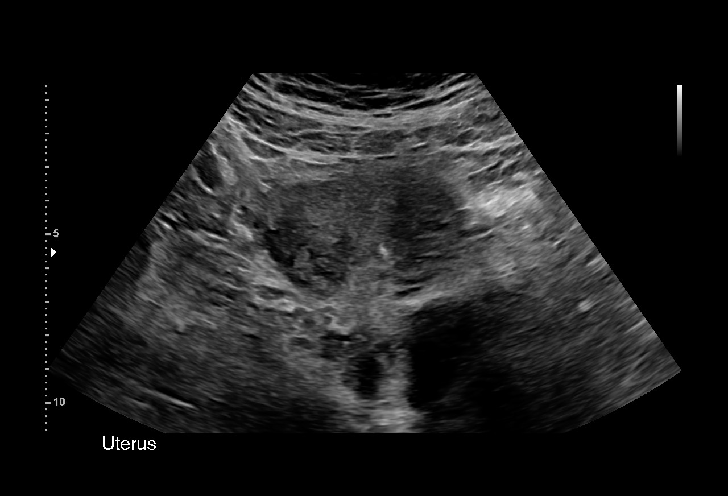
[im 15/67]
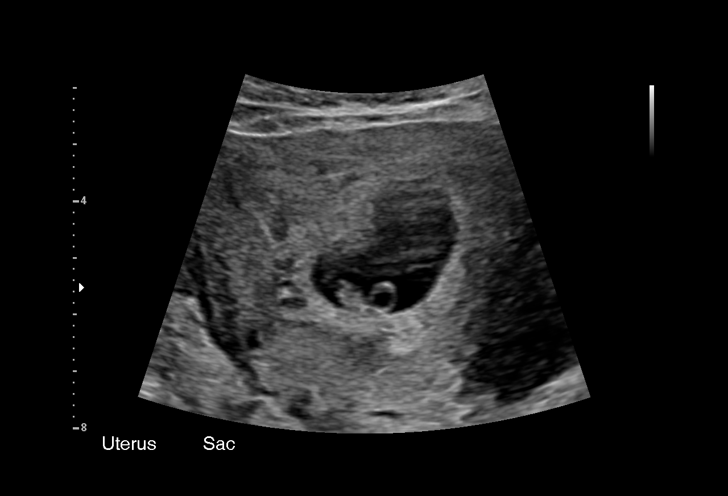
[im 20/67]
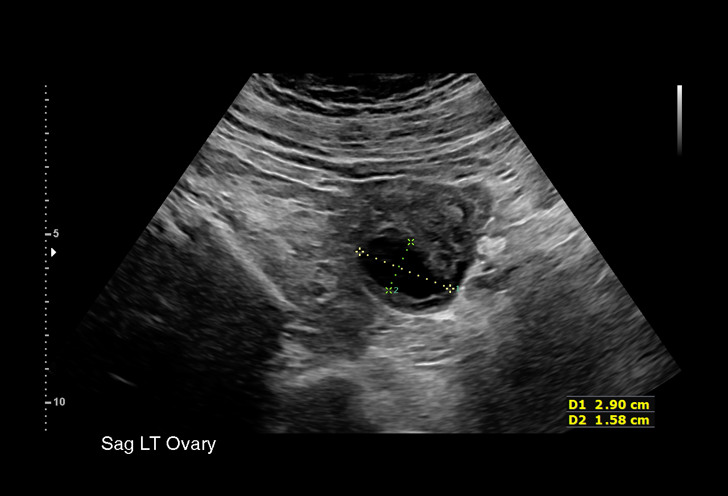
[im 25/67]
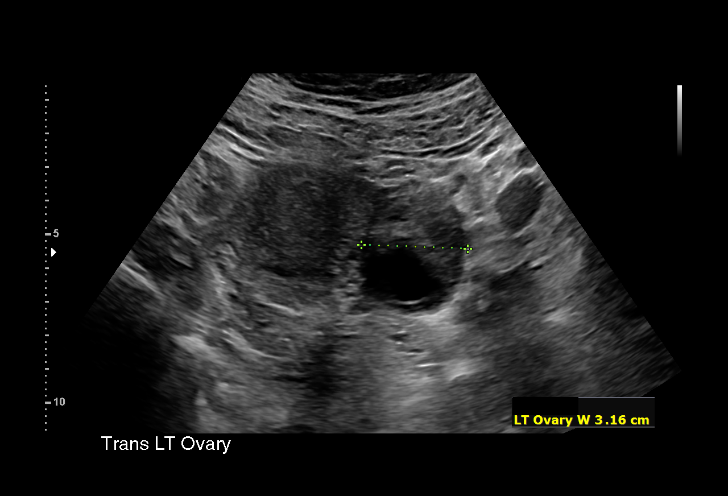
[im 30/67]
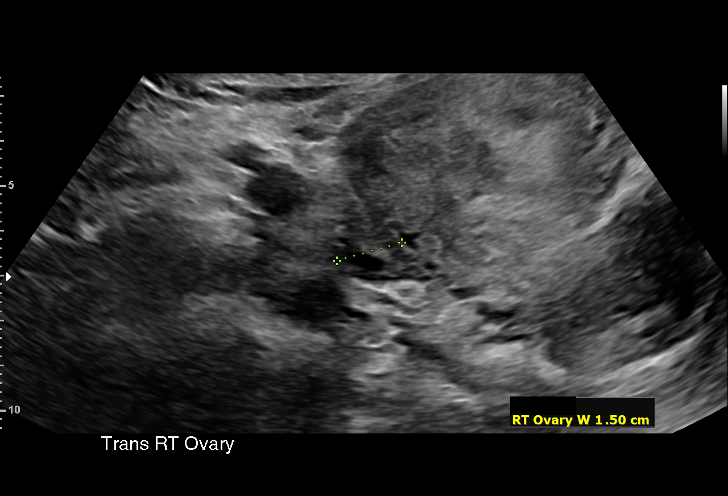
[im 35/67]
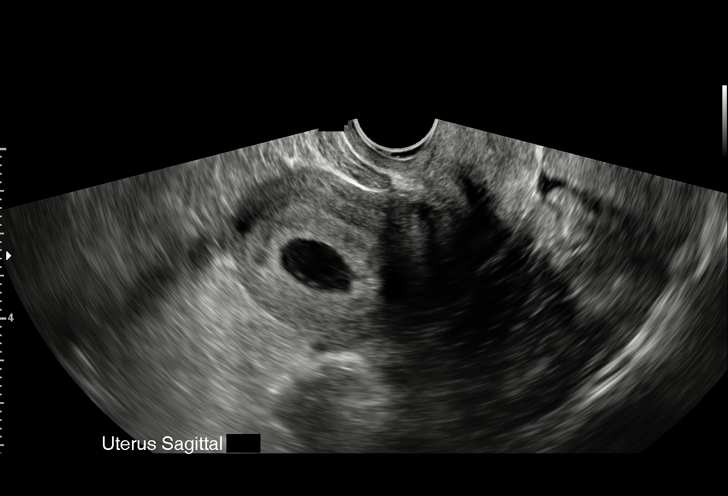
[im 37/67]
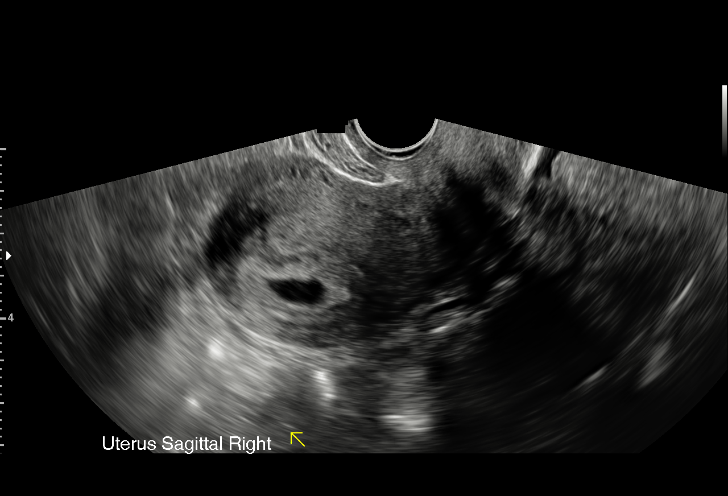
[im 42/67]
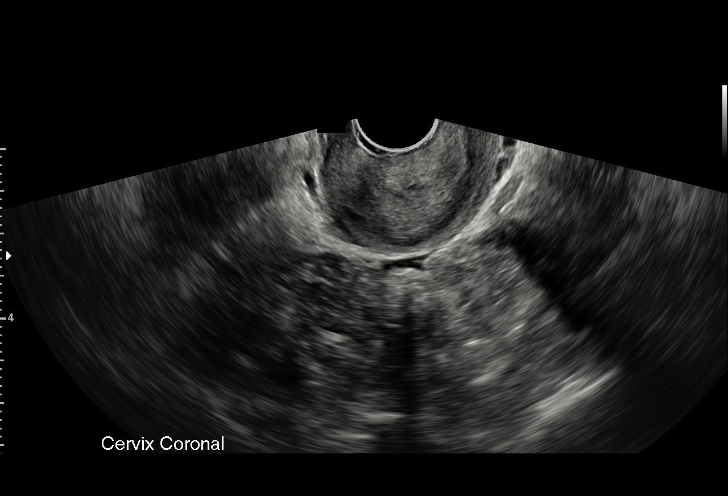
[im 47/67]
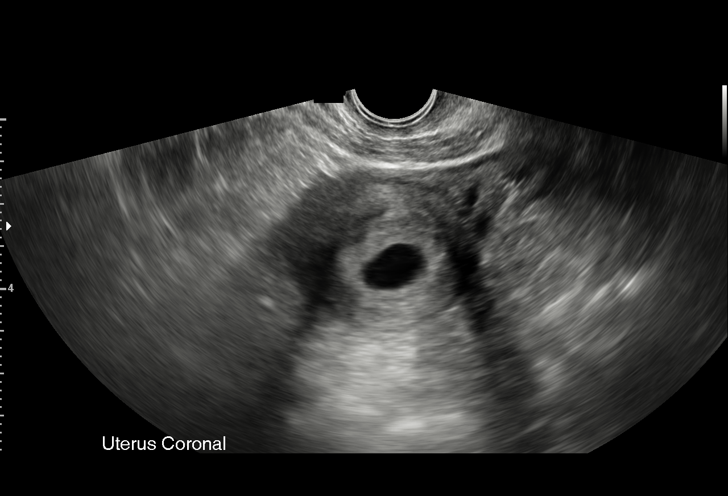
[im 52/67]
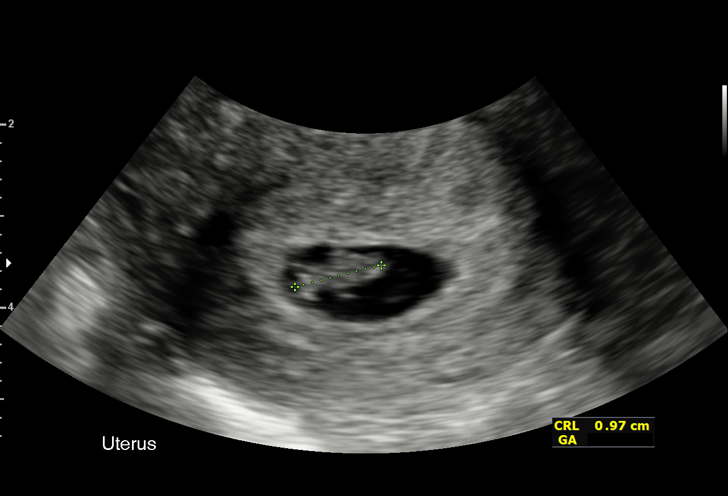
[im 57/67]
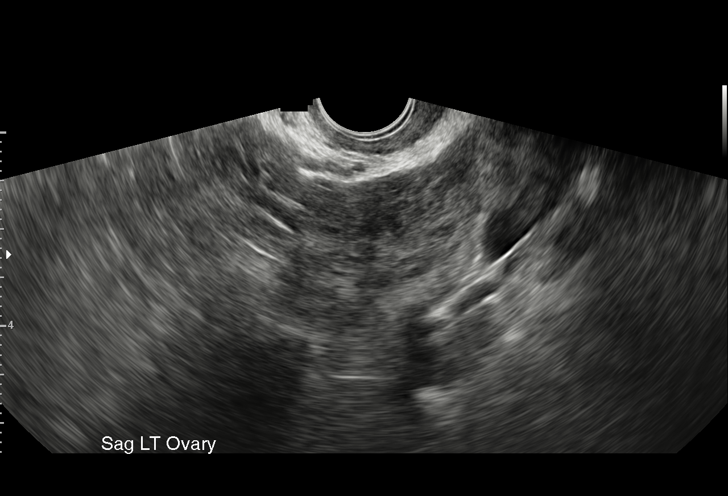
[im 62/67]
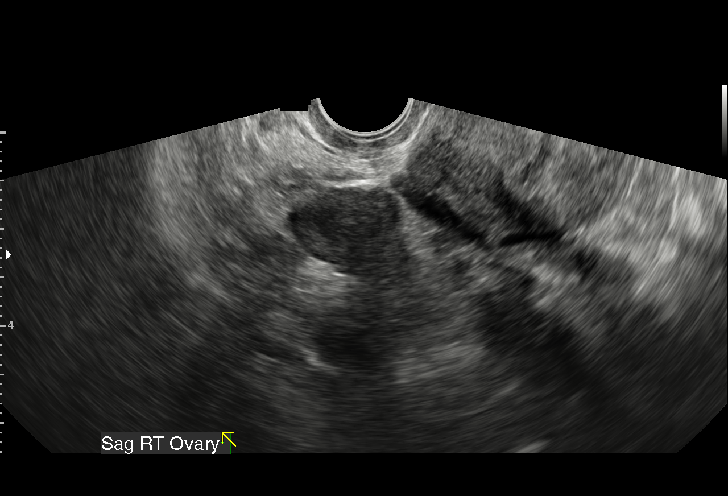
[im 67/67]
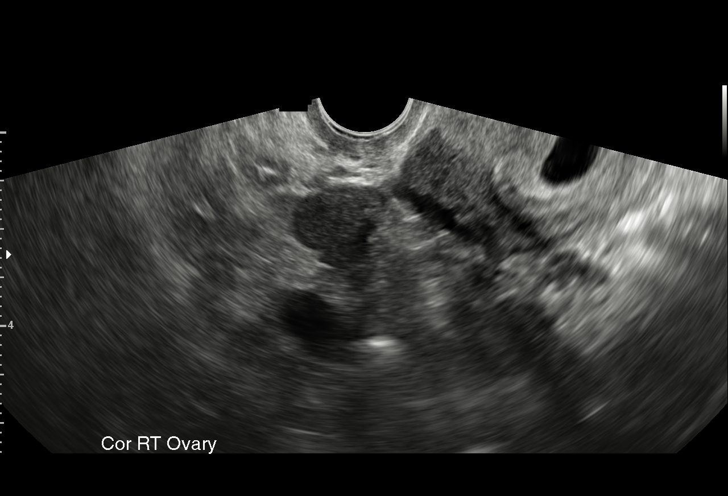

[15 of 28 positions shown; findings below may reference images not displayed]

FINDINGS: Intrauterine gestational sac: Single

Yolk sac:  Visualized.

Embryo:  Visualized.

Cardiac Activity: Visualized.

Heart Rate: 148 bpm

CRL:  9.9 mm   7 w   0 d                  US EDC: 10/13/2018

Subchorionic hemorrhage:  None visualized.

Maternal uterus/adnexae: Corpus luteum on the left. Unremarkable
right ovary.
IMPRESSION: Single living intrauterine pregnancy measuring 7 weeks. No abnormal
finding.

## 2024-01-13 ENCOUNTER — Ambulatory Visit: Payer: Self-pay | Admitting: Nurse Practitioner
